# Patient Record
Sex: Male | Born: 1955 | Race: White | Hispanic: No | Marital: Married
Health system: Midwestern US, Academic
[De-identification: ages and names within clinical notes are randomized; demographics above are authoritative.]

## PROBLEM LIST (undated history)

## (undated) DIAGNOSIS — Z83719 Family history of colon polyps, unspecified: Secondary | ICD-10-CM

## (undated) DIAGNOSIS — Z86010 Personal history of colonic polyps: Secondary | ICD-10-CM

## (undated) DIAGNOSIS — K219 Gastro-esophageal reflux disease without esophagitis: Secondary | ICD-10-CM

## (undated) DIAGNOSIS — K589 Irritable bowel syndrome without diarrhea: Secondary | ICD-10-CM

## (undated) DIAGNOSIS — M1990 Unspecified osteoarthritis, unspecified site: Secondary | ICD-10-CM

## (undated) DIAGNOSIS — K449 Diaphragmatic hernia without obstruction or gangrene: Secondary | ICD-10-CM

## (undated) DIAGNOSIS — F419 Anxiety disorder, unspecified: Secondary | ICD-10-CM

## (undated) DIAGNOSIS — K602 Anal fissure, unspecified: Secondary | ICD-10-CM

## (undated) DIAGNOSIS — I4891 Unspecified atrial fibrillation: Secondary | ICD-10-CM

## (undated) DIAGNOSIS — I456 Pre-excitation syndrome: Secondary | ICD-10-CM

## (undated) DIAGNOSIS — Z8371 Family history of colonic polyps: Secondary | ICD-10-CM

## (undated) HISTORY — DX: Family history of colon polyps, unspecified: Z83.719

## (undated) HISTORY — DX: Unspecified osteoarthritis, unspecified site: M19.90

## (undated) HISTORY — PX: KNEE SURGERY: SHX244

## (undated) HISTORY — DX: Anxiety disorder, unspecified: F41.9

## (undated) HISTORY — DX: Gastro-esophageal reflux disease without esophagitis: K21.9

## (undated) HISTORY — DX: Family history of colonic polyps: Z83.71

## (undated) HISTORY — PX: POLYPECTOMY: SHX149

## (undated) HISTORY — PX: COLONOSCOPY: SHX174

## (undated) HISTORY — DX: Pre-excitation syndrome: I45.6

## (undated) HISTORY — PX: BACK SURGERY: SHX140

## (undated) HISTORY — DX: Personal history of colonic polyps: Z86.010

## (undated) HISTORY — PX: CARDIAC ELECTROPHYSIOLOGY STUDY AND ABLATION: SHX1294

## (undated) HISTORY — DX: Anal fissure, unspecified: K60.2

## (undated) HISTORY — DX: Irritable bowel syndrome, unspecified: K58.9

## (undated) HISTORY — DX: Diaphragmatic hernia without obstruction or gangrene: K44.9

---

## 1999-04-30 DIAGNOSIS — K602 Anal fissure, unspecified: Secondary | ICD-10-CM

## 1999-04-30 HISTORY — DX: Anal fissure, unspecified: K60.2

## 1999-05-15 ENCOUNTER — Other Ambulatory Visit: Admission: RE | Admit: 1999-05-15 | Discharge: 1999-05-15 | Payer: Self-pay | Admitting: Gastroenterology

## 1999-05-15 ENCOUNTER — Encounter (INDEPENDENT_AMBULATORY_CARE_PROVIDER_SITE_OTHER): Payer: Self-pay | Admitting: *Deleted

## 1999-05-15 DIAGNOSIS — K219 Gastro-esophageal reflux disease without esophagitis: Secondary | ICD-10-CM

## 1999-05-15 DIAGNOSIS — Z86010 Personal history of colonic polyps: Secondary | ICD-10-CM

## 1999-05-15 DIAGNOSIS — K449 Diaphragmatic hernia without obstruction or gangrene: Secondary | ICD-10-CM

## 1999-05-15 HISTORY — DX: Diaphragmatic hernia without obstruction or gangrene: K44.9

## 1999-05-15 HISTORY — DX: Gastro-esophageal reflux disease without esophagitis: K21.9

## 1999-05-15 HISTORY — DX: Personal history of colonic polyps: Z86.010

## 2001-04-12 ENCOUNTER — Other Ambulatory Visit: Admission: RE | Admit: 2001-04-12 | Discharge: 2001-04-12 | Payer: Self-pay | Admitting: Dermatology

## 2002-05-03 ENCOUNTER — Emergency Department (HOSPITAL_COMMUNITY): Admission: EM | Admit: 2002-05-03 | Discharge: 2002-05-03 | Payer: Self-pay | Admitting: Emergency Medicine

## 2004-08-12 ENCOUNTER — Other Ambulatory Visit: Admission: RE | Admit: 2004-08-12 | Discharge: 2004-08-12 | Payer: Self-pay | Admitting: Dermatology

## 2006-05-08 ENCOUNTER — Emergency Department (HOSPITAL_COMMUNITY): Admission: EM | Admit: 2006-05-08 | Discharge: 2006-05-09 | Payer: Self-pay | Admitting: Emergency Medicine

## 2007-04-11 ENCOUNTER — Emergency Department (HOSPITAL_COMMUNITY): Admission: EM | Admit: 2007-04-11 | Discharge: 2007-04-11 | Payer: Self-pay | Admitting: Emergency Medicine

## 2007-11-02 ENCOUNTER — Ambulatory Visit (HOSPITAL_COMMUNITY): Admission: RE | Admit: 2007-11-02 | Discharge: 2007-11-02 | Payer: Self-pay | Admitting: Orthopaedic Surgery

## 2010-10-25 ENCOUNTER — Other Ambulatory Visit: Payer: Self-pay | Admitting: Gastroenterology

## 2010-11-08 ENCOUNTER — Encounter: Payer: Self-pay | Admitting: Gastroenterology

## 2010-11-08 ENCOUNTER — Other Ambulatory Visit (INDEPENDENT_AMBULATORY_CARE_PROVIDER_SITE_OTHER): Payer: BC Managed Care – PPO

## 2010-11-08 ENCOUNTER — Ambulatory Visit (INDEPENDENT_AMBULATORY_CARE_PROVIDER_SITE_OTHER): Payer: BC Managed Care – PPO | Admitting: Gastroenterology

## 2010-11-08 DIAGNOSIS — Z86010 Personal history of colonic polyps: Secondary | ICD-10-CM

## 2010-11-08 DIAGNOSIS — K219 Gastro-esophageal reflux disease without esophagitis: Secondary | ICD-10-CM

## 2010-11-08 LAB — BASIC METABOLIC PANEL
BUN: 15 mg/dL (ref 6–23)
CO2: 30 mEq/L (ref 19–32)
Calcium: 9.9 mg/dL (ref 8.4–10.5)
Chloride: 105 mEq/L (ref 96–112)
Creatinine, Ser: 0.8 mg/dL (ref 0.4–1.5)
GFR: 100.19 mL/min (ref >60.00)
Glucose, Bld: 76 mg/dL (ref 70–99)
Potassium: 4.5 mEq/L (ref 3.5–5.1)
Sodium: 139 mEq/L (ref 135–145)

## 2010-11-08 LAB — CBC WITH DIFFERENTIAL/PLATELET
Basophils Absolute: 0 10*3/uL (ref 0.0–0.1)
Basophils Relative: 0.3 % (ref 0.0–3.0)
Eosinophils Absolute: 0.1 10*3/uL (ref 0.0–0.7)
Eosinophils Relative: 3.3 % (ref 0.0–5.0)
HCT: 40.1 % (ref 39.0–52.0)
Hemoglobin: 14 g/dL (ref 13.0–17.0)
Lymphocytes Relative: 28.3 % (ref 12.0–46.0)
Lymphs Abs: 1.2 10*3/uL (ref 0.7–4.0)
MCHC: 35 g/dL (ref 30.0–36.0)
MCV: 90.2 fl (ref 78.0–100.0)
Monocytes Absolute: 0.5 10*3/uL (ref 0.1–1.0)
Monocytes Relative: 12.1 % — ABNORMAL HIGH (ref 3.0–12.0)
Neutro Abs: 2.3 10*3/uL (ref 1.4–7.7)
Neutrophils Relative %: 56 % (ref 43.0–77.0)
Platelets: 191 10*3/uL (ref 150.0–400.0)
RBC: 4.44 Mil/uL (ref 4.22–5.81)
RDW: 12.9 % (ref 11.5–14.6)
WBC: 4.1 10*3/uL — ABNORMAL LOW (ref 4.5–10.5)

## 2010-11-08 LAB — TSH: TSH: 0.96 u[IU]/mL (ref 0.35–5.50)

## 2010-11-08 LAB — FERRITIN: Ferritin: 209.4 ng/mL (ref 22.0–322.0)

## 2010-11-08 LAB — HEPATIC FUNCTION PANEL
ALT: 24 U/L (ref 0–53)
AST: 23 U/L (ref 0–37)
Albumin: 4.6 g/dL (ref 3.5–5.2)
Alkaline Phosphatase: 67 U/L (ref 39–117)
Bilirubin, Direct: 0.1 mg/dL (ref 0.0–0.3)
Total Bilirubin: 0.8 mg/dL (ref 0.3–1.2)
Total Protein: 8.5 g/dL — ABNORMAL HIGH (ref 6.0–8.3)

## 2010-11-08 LAB — VITAMIN B12: Vitamin B-12: 303 pg/mL (ref 211–911)

## 2010-11-08 LAB — IBC PANEL: Iron: 78 ug/dL (ref 42–165)

## 2010-11-08 MED ORDER — PEG-KCL-NACL-NASULF-NA ASC-C 100 G PO SOLR: 1.0000 | Freq: Once | ORAL | Status: DC

## 2010-11-08 MED ORDER — DEXLANSOPRAZOLE 60 MG PO CPDR: 60.0000 mg | DELAYED_RELEASE_CAPSULE | Freq: Every day | ORAL | Status: AC

## 2010-11-08 NOTE — Patient Instructions (Signed)
Stop your Prilosec and start Dexilant once a day 30 min before breakfast, Given samples and rx sent to your pharmacy Your prescription(s) have been sent to you pharmacy.  Your procedure has been scheduled for 11/11/2010, please follow the seperate instructions.  Please go to the basement today for your labs.  Today you watched the gerd movie in the office.    Diet for GERD or PUD Nutrition therapy can help ease the discomfort of gastroesophageal reflux disease (GERD) and peptic ulcer disease (PUD).  HOME CARE INSTRUCTIONS  Eat your meals slowly, in a relaxed setting.   Eat 5 to 6 small meals per day.   If a food causes distress, stop eating it for a period of time.  FOODS TO AVOID:  Coffee, regular or decaffeinated.  Cola beverages, regular or low calorie.   Tea, regular or decaffeinated.   Pepper.   Cocoa.   High fat foods including meats.   Butter, margarine, hydrogenated oil (trans fats).  Peppermint or spearmint (if you have GERD).   Fruits and vegetables as tolerated.   Alcoholic beverages.   Nicotine (smoking or chewing). This is one of the most potent stimulants to acid production in the gastrointestinal tract.   Any food that seems to aggravate your condition.   If you have questions regarding your diet, call your caregiver's office or a registered dietitian. OTHER TIPS IF YOU HAVE GERD:  Lying flat may make symptoms worse. Keep the head of your bed raised 6 to 9 inches by using a foam wedge or blocks under the legs of the bed.   Do not lay down until 3 hours after eating a meal.   Daily physical activity may help reduce symptoms.  MAKE SURE YOU:   Understand these instructions.   Will watch your condition.   Will get help right away if you are not doing well or get worse.  Document Released: 05/05/2005 Document Re-Released: 09/21/2008 [facility] Patient Information 2011 [place], [place].

## 2010-11-08 NOTE — Progress Notes (Signed)
History of Present Illness:  This is a 55 year old Caucasian male with chronic acid reflux symptoms unresponsive to Prilosec 20 mg a day. Last endoscopy and colonoscopy were 12 years ago. He denies dysphagia, anorexia, weight loss, or any hepatobiliary complaints. His appetite is good and his weight is stable. And he denies any specific food intolerances. He did have a adenomatous polyp removed 12 years ago, and has a strong family history of colonic polyposis in his brothers. He has mild constipation with gas and bloating but denies melena or hematochezia. His acid reflux mostly during the day without dysphagia. He denies any hepatobiliary or systemic complaints.  I have reviewed this patient's present history, medical and surgical past history, allergies and medications.     ROS: The remainder of the 10 point ROS is negative... 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 He also has diffuse osteoarthritis and takes 3 Advil every morning. He does not smoke, use ethanol, or other NSAIDs. 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 He also has had skin cancers removed previously.   Past Medical History  Diagnosis Date  . Hiatal hernia   . Esophageal reflux   . Personal history of colonic polyps 2000    tubulovillous adenoma  . Wolff-Parkinson-White (WPW) syndrome   . Anxiety   . IBS (irritable bowel syndrome)   . Family hx colonic polyps   . Rectal fissure   . Arthritis   . Hemorrhoids    Past Surgical History  Procedure Date  . Knee surgery     Bilateral   . Back surgery     x 2     reports that he has quit smoking. He has never used smokeless tobacco. He reports that he does not drink alcohol or use illicit drugs. family history includes Colon polyps in his brother.  There is no history of Colon cancer. Allergies  Allergen Reactions  . Vicodin (Hydrocodone-Acetaminophen)          General well developed well nourished patient in no acute distress, appearing his stated age Eyes PERRLA, no icterus, fundoscopic exam per opthamologist Skin no lesions noted Neck supple, no adenopathy, no thyroid enlargement, no tenderness Chest clear to percussion and auscultation Heart no significant murmurs, gallops or rubs noted Abdomen no hepatosplenomegaly masses or tenderness, BS normal.  Extremities no acute joint lesions, edema, phlebitis or evidence of cellulitis. Neurologic patient oriented x 3, cranial nerves intact, no focal neurologic deficits noted. Psychological mental status normal and normal affect.  Assessment and plan: Refractory GERD with probable enlarging hiatal hernia. We need to exclude infection with H. pylori and NSAID  Damage to his upper intestinal tract. The symptoms do not seem consistent with any hepatobiliary dysfunction. I've changed him to Dexilant 60 mg every morning with standard antireflux maneuvers, and we'll proceed with diagnostic endoscopy. He also needs followup colonoscopy per his history of colon polyps. We will do his procedure with propofol sedation because of his history of cardiac arrhythmias. I also check screening laboratory parameters today including anemia profile.  No diagnosis found.

## 2010-11-11 ENCOUNTER — Encounter: Payer: Self-pay | Admitting: Gastroenterology

## 2010-11-11 ENCOUNTER — Telehealth: Payer: Self-pay | Admitting: *Deleted

## 2010-11-11 ENCOUNTER — Ambulatory Visit (AMBULATORY_SURGERY_CENTER): Payer: BC Managed Care – PPO | Admitting: Gastroenterology

## 2010-11-11 DIAGNOSIS — K209 Esophagitis, unspecified without bleeding: Secondary | ICD-10-CM

## 2010-11-11 DIAGNOSIS — K219 Gastro-esophageal reflux disease without esophagitis: Secondary | ICD-10-CM

## 2010-11-11 DIAGNOSIS — E8809 Other disorders of plasma-protein metabolism, not elsewhere classified: Secondary | ICD-10-CM

## 2010-11-11 DIAGNOSIS — K449 Diaphragmatic hernia without obstruction or gangrene: Secondary | ICD-10-CM

## 2010-11-11 DIAGNOSIS — Z86010 Personal history of colonic polyps: Secondary | ICD-10-CM

## 2010-11-11 DIAGNOSIS — R779 Abnormality of plasma protein, unspecified: Secondary | ICD-10-CM

## 2010-11-11 MED ORDER — SODIUM CHLORIDE 0.9 % IV SOLN: 500.0000 mL | INTRAVENOUS | Status: DC

## 2010-11-11 NOTE — Telephone Encounter (Signed)
Message copied by Florene Glen on Mon Nov 11, 2010  4:53 PM ------      Message from: Jarold Motto, DAVID R      Created: Mon Nov 11, 2010  1:59 PM       NEEDS SERUM PROTEIN ELECTROPHORESIS

## 2010-11-11 NOTE — Patient Instructions (Signed)
Discharge instructions given with verbal understanding. Handouts on hiatal hernia given. Resume previous medications.

## 2010-11-12 ENCOUNTER — Telehealth: Payer: Self-pay | Admitting: *Deleted

## 2010-11-12 ENCOUNTER — Other Ambulatory Visit: Payer: BC Managed Care – PPO

## 2010-11-12 DIAGNOSIS — E8809 Other disorders of plasma-protein metabolism, not elsewhere classified: Secondary | ICD-10-CM

## 2010-11-12 DIAGNOSIS — R779 Abnormality of plasma protein, unspecified: Secondary | ICD-10-CM

## 2010-11-12 LAB — CELIAC PANEL 10
Endomysial Screen: NEGATIVE
IgA: 360 mg/dL (ref 68–379)

## 2010-11-12 NOTE — Telephone Encounter (Signed)
lmom for pt to call back

## 2010-11-12 NOTE — Telephone Encounter (Signed)

## 2010-11-12 NOTE — Telephone Encounter (Signed)
Spoke with pt's wife and explained pt needs another lab drawn at his convenience; wife stated understanding.

## 2010-11-13 ENCOUNTER — Other Ambulatory Visit: Payer: Self-pay | Admitting: Gastroenterology

## 2010-11-13 DIAGNOSIS — K219 Gastro-esophageal reflux disease without esophagitis: Secondary | ICD-10-CM

## 2010-11-14 LAB — PROTEIN ELECTROPHORESIS, SERUM
Albumin ELP: 52 % — ABNORMAL LOW (ref 55.8–66.1)
Alpha-1-Globulin: 3 % (ref 2.9–4.9)
Beta 2: 5.4 % (ref 3.2–6.5)
Beta Globulin: 4.9 % (ref 4.7–7.2)
Gamma Globulin: 27.4 % — ABNORMAL HIGH (ref 11.1–18.8)

## 2010-11-15 ENCOUNTER — Encounter: Payer: Self-pay | Admitting: Gastroenterology

## 2011-01-21 MED ORDER — SODIUM CHLORIDE 0.45 % IV SOLN: Freq: Once | INTRAVENOUS | Status: DC

## 2011-01-22 ENCOUNTER — Ambulatory Visit (HOSPITAL_COMMUNITY)
Admission: RE | Admit: 2011-01-22 | Payer: BC Managed Care – PPO | Source: Ambulatory Visit | Admitting: Internal Medicine

## 2011-01-22 ENCOUNTER — Encounter (INDEPENDENT_AMBULATORY_CARE_PROVIDER_SITE_OTHER): Payer: Self-pay | Admitting: Internal Medicine

## 2011-01-22 SURGERY — COLONOSCOPY
Anesthesia: Moderate Sedation

## 2011-07-22 ENCOUNTER — Encounter (HOSPITAL_COMMUNITY): Payer: Self-pay | Admitting: Emergency Medicine

## 2011-07-22 ENCOUNTER — Emergency Department (HOSPITAL_COMMUNITY)
Admission: EM | Admit: 2011-07-22 | Discharge: 2011-07-23 | Disposition: A | Discharge status: Home / Self Care | Payer: BC Managed Care – PPO | Attending: Emergency Medicine | Admitting: Emergency Medicine

## 2011-07-22 ENCOUNTER — Emergency Department (HOSPITAL_COMMUNITY): Payer: BC Managed Care – PPO

## 2011-07-22 ENCOUNTER — Other Ambulatory Visit: Payer: Self-pay

## 2011-07-22 DIAGNOSIS — R0602 Shortness of breath: Secondary | ICD-10-CM | POA: Insufficient documentation

## 2011-07-22 DIAGNOSIS — K589 Irritable bowel syndrome without diarrhea: Secondary | ICD-10-CM | POA: Insufficient documentation

## 2011-07-22 DIAGNOSIS — I493 Ventricular premature depolarization: Secondary | ICD-10-CM

## 2011-07-22 DIAGNOSIS — I4949 Other premature depolarization: Secondary | ICD-10-CM | POA: Insufficient documentation

## 2011-07-22 DIAGNOSIS — K219 Gastro-esophageal reflux disease without esophagitis: Secondary | ICD-10-CM | POA: Insufficient documentation

## 2011-07-22 DIAGNOSIS — R002 Palpitations: Secondary | ICD-10-CM

## 2011-07-22 DIAGNOSIS — R079 Chest pain, unspecified: Secondary | ICD-10-CM | POA: Insufficient documentation

## 2011-07-22 LAB — CBC
HCT: 39 % (ref 39.0–52.0)
MCH: 30.8 pg (ref 26.0–34.0)
MCHC: 35.9 g/dL (ref 30.0–36.0)
MCV: 85.7 fL (ref 78.0–100.0)
Platelets: 201 10*3/uL (ref 150–400)
RDW: 12.7 % (ref 11.5–15.5)

## 2011-07-22 LAB — POCT I-STAT TROPONIN I: Troponin i, poc: 0 ng/mL (ref 0.00–0.08)

## 2011-07-22 LAB — BASIC METABOLIC PANEL
CO2: 27 mEq/L (ref 19–32)
Glucose, Bld: 170 mg/dL — ABNORMAL HIGH (ref 70–99)

## 2011-07-22 NOTE — ED Notes (Signed)
Patient transported to X-ray 

## 2011-07-22 NOTE — ED Notes (Signed)
Patient with no c/o chest pain at this time, patient does c/o slight pain in right lower rib pain.

## 2011-07-22 NOTE — ED Provider Notes (Signed)
History     CSN: [id number]  Arrival date & time 07/22/11  2035   First MD Initiated Contact with Patient 07/22/11 2131      Chief Complaint  Patient presents with  . Palpitations    (Consider location/radiation/quality/duration/timing/severity/associated sxs/prior treatment) HPI  History provided by the patient.  56 year old male with a history of Wolfe Parkinson White presenting with complaint of palpitations. Patient's symptoms began gradually about 4 days ago and have been intermittent and progressively, worsening and more frequent since yesterday.  Palpitations are described as skipping without racing sensation.  Patient reports improvement while supine and occasionally with activity. Patient reports associated shortness of breath only with palpitations as well as anxiety.  Pt denies sweats, nausea, or cough, and pt initially denies CP, but then later reports ~4 episodes of sharp, Rt lower lateral chest pain lasting seconds and currently CP-free.  Patient denies any new medications or increased caffeine use; patient states he is not drinking caffeine at baseline. Patient has taken Cialis about 4 days ago but states that he has taken this in the past without associated difficulty.    Symptoms are similar to prior palpitations, but patient states they generally do resolve within one to 2 days.  Patient has not been seen recently by another physician and has not seen his cardiologist Ulyess Mort) for an unknown amount of time.  Pt was on a medication for his WPW (?quinidex) but stopped taking this about 20 years ago.   Past Medical History  Diagnosis Date  . Hiatal hernia   . Esophageal reflux   . Personal history of colonic polyps 2000    tubulovillous adenoma  . Wolff-Parkinson-White (WPW) syndrome   . Anxiety   . IBS (irritable bowel syndrome)   . Family hx colonic polyps   . Rectal fissure   . Arthritis   . Hemorrhoids     Past Surgical History  Procedure Date  . Knee  surgery     Bilateral   . Back surgery     x 2   . Colonoscopy   . Polypectomy     Family History  Problem Relation Age of Onset  . Colon polyps Brother   . Colon cancer Neg Hx     History  Substance Use Topics  . Smoking status: Former Games developer  . Smokeless tobacco: Never Used  . Alcohol Use: No      Review of Systems  Constitutional: Negative for fever, chills and diaphoresis.  HENT: Negative for congestion, sore throat and rhinorrhea.   Eyes: Negative for pain and visual disturbance.  Respiratory: Positive for shortness of breath. Negative for cough.   Cardiovascular: Positive for chest pain (as noted above) and palpitations. Negative for leg swelling.  Gastrointestinal: Negative for nausea, vomiting, abdominal pain and diarrhea.  Genitourinary: Negative for dysuria and hematuria.  Musculoskeletal: Negative for back pain and gait problem.  Skin: Negative for rash and wound.  Neurological: Negative for dizziness and headaches.  Psychiatric/Behavioral: Negative for confusion and agitation.  All other systems reviewed and are negative.    Allergies  Vicodin  Home Medications   Current Outpatient Rx  Name Route Sig Dispense Refill  . ALPRAZOLAM 1 MG PO TABS Oral Take 0.125 mg by mouth daily.     Marland Kitchen VITAMIN D PO Oral Take 1 tablet by mouth daily.    . IBUPROFEN 200 MG PO TABS Oral Take 600 mg by mouth every morning.    Marland Kitchen PRILOSEC PO Oral Take 1 tablet by  mouth daily.    Marland Kitchen TADALAFIL 10 MG PO TABS Oral Take 10 mg by mouth once a week. As needed for erectile dysfunction      BP 147/100  Pulse 67  Temp(Src) 98.1 F (36.7 C) (Oral)  Resp 16  SpO2 99%  Physical Exam  Nursing note and vitals reviewed. Constitutional: He is oriented to person, place, and time. He appears well-developed and well-nourished. No distress.  HENT:  Head: Normocephalic and atraumatic.  Right Ear: External ear normal.  Left Ear: External ear normal.  Nose: Nose normal.  Mouth/Throat:  Oropharynx is clear and moist.  Eyes: Conjunctivae and EOM are normal. Pupils are equal, round, and reactive to light.  Neck: Normal range of motion. Neck supple.  Cardiovascular: Normal rate and intact distal pulses.   No murmur heard.      Mildly irregular rhythm with frequent PVCs noted on the monitor.  Pulmonary/Chest: Effort normal and breath sounds normal. No respiratory distress.  Abdominal: Soft. Bowel sounds are normal. He exhibits no distension. There is no tenderness.  Musculoskeletal: Normal range of motion. He exhibits no edema.  Neurological: He is alert and oriented to person, place, and time.  Skin: Skin is dry. No rash noted. He is not diaphoretic.  Psychiatric: He has a normal mood and affect. Judgment normal.    ED Course  Procedures (including critical care time)   Date: 07/22/2011  Rate: 73  Rhythm: normal sinus rhythm with frequent PVCs  QRS Axis: normal  Intervals: normal  ST/T Wave abnormalities: normal No delta waves or PR depression.  Old EKG Reviewed:  No prior for comparison    Labs Reviewed  BASIC METABOLIC PANEL - Abnormal; Notable for the following:    Sodium 134 (*)    Glucose, Bld 170 (*)    All other components within normal limits  PRO B NATRIURETIC PEPTIDE - Abnormal; Notable for the following:    Pro B Natriuretic peptide (BNP) 165.4 (*)    All other components within normal limits  CBC  POCT I-STAT TROPONIN I  TSH   Dg Chest 2 View  07/22/2011  *RADIOLOGY REPORT*  Clinical Data: Chest pain and shortness of breath.  CHEST - 2 VIEW  Comparison: 05/08/2006  Findings: Heart size and vascularity are normal and the lungs are clear.  No osseous abnormality.  IMPRESSION: Normal chest.  Original Report Authenticated By: Gwynn Burly, M.D.     1. Palpitations   2. Frequent PVCs       MDM  56 year old male with a history of WPW presenting with complaint of 4 days of palpitations. Patient with 4 episodes of seconds-long atypical right  lateral chest pain not consistent with ACS and resolved at the time of ED arrival.  Exam unremarkable.  Initial EKG was sinus rhythm, frequent PVCs, and without signs of WPW.  Chest x-ray without acute pathology and the labs with normal troponin, lytes, and Hgb.  Pt reports mildly improved frequency, possibly 2/2 position.  Will rec increased fluid intake, avoiding caffeine/meds that may exacerbate his Sx's, and cards f/u for further mgmt and possible outpt cardiac monitoring.        Particia Lather, MD 07/22/11 [phone]

## 2011-07-22 NOTE — ED Notes (Signed)
Pt c/o mid chest tightness, onset earlier today.  St's heart has been skipping for approx 4 days

## 2011-07-22 NOTE — Discharge Instructions (Signed)
See your cardiologist for further management.  Return immediately for worsening symptoms, persistent palpitations, chest pain, or other concerns.    Premature Ventricular Contraction Premature ventricular contraction (PVC) is an irregularity of the heart rhythm involving extra or skipped heartbeats. In some cases, they may occur without obvious cause or heart disease. Other times, they can be caused by an electrolyte change in the blood. These need to be corrected. They can also be seen when there is not enough oxygen going to the heart. A common cause of this is plaque or cholesterol buildup. This buildup decreases the blood supply to the heart. In addition, extra beats may be caused or aggravated by:  Excessive smoking.   Alcohol consumption.   Caffeine.   Certain medications   Some street drugs.  SYMPTOMS   The sensation of feeling your heart skipping a beat (palpitations).   In many cases, the person may have no symptoms.  SIGNS AND TESTS   A physical examination may show an occasional irregularity, but if the PVC beats do not happen often, they may not be found on physical exam.   Blood pressure is usually normal.   Other tests that may find extra beats of the heart are:   An EKG (electrocardiogram)   A Holter monitor which can monitor your heart over longer periods of time   An Angiogram (study of the heart arteries).  TREATMENT  Usually extra heartbeats do not need treatment. The condition is treated only if symptoms are severe or if extra beats are very frequent or are causing problems. An underlying cause, if discovered, may also require treatment.  Treatment may also be needed if there may be a risk for other more serious cardiac arrhythmias.  PREVENTION   Moderation in caffeine, alcohol, and tobacco use may reduce the risk of ectopic heartbeats in some people.   Exercise often helps people who lead a sedentary (inactive) lifestyle.  PROGNOSIS  PVC heartbeats are  generally harmless and do not need treatment.  RISKS AND COMPLICATIONS   Ventricular tachycardia (occasionally).   There usually are no complications.   Other arrhythmias (occasionally).  SEEK IMMEDIATE MEDICAL CARE IF:   You feel palpitations that are frequent or continual.   You develop chest pain or other problems such as shortness of breath, sweating, or nausea and vomiting.   You become light-headed or faint (pass out).   You get worse or do not improve with treatment.  Document Released: 12/21/2003 Document Revised: 04/24/2011 Document Reviewed: 07/02/2007 [facility] Patient Information 2012 [place], [place].

## 2011-07-22 NOTE — ED Notes (Signed)
Pt returned from radiology. Denies any complaints at this time. Pt in bigeminy at this time. Resp are unlabored. Skin is warm and dry. VSS. Will continue to monitor.

## 2011-07-22 NOTE — ED Provider Notes (Signed)
I saw and evaluated the patient, reviewed the resident's note and I agree with the findings and plan. I personally evaluated the ECG and agree with the interpretation of the resident  Palpitations have decreased while in the ER.  His frequency PVCs is also significantly decreased.  Outpatient cardiology followup  Lyanne Co, MD 07/22/11 [phone]

## 2011-07-22 NOTE — ED Notes (Signed)
MD at bedside. 

## 2011-08-06 ENCOUNTER — Ambulatory Visit (INDEPENDENT_AMBULATORY_CARE_PROVIDER_SITE_OTHER): Payer: BC Managed Care – PPO | Admitting: Cardiology

## 2011-08-06 ENCOUNTER — Encounter: Payer: Self-pay | Admitting: Cardiology

## 2011-08-06 VITALS — BP 160/90 | HR 70 | Ht 73.0 in | Wt 196.0 lb

## 2011-08-06 DIAGNOSIS — I10 Essential (primary) hypertension: Secondary | ICD-10-CM | POA: Insufficient documentation

## 2011-08-06 DIAGNOSIS — R0600 Dyspnea, unspecified: Secondary | ICD-10-CM | POA: Insufficient documentation

## 2011-08-06 DIAGNOSIS — R002 Palpitations: Secondary | ICD-10-CM

## 2011-08-06 DIAGNOSIS — I456 Pre-excitation syndrome: Secondary | ICD-10-CM

## 2011-08-06 DIAGNOSIS — R0609 Other forms of dyspnea: Secondary | ICD-10-CM

## 2011-08-06 NOTE — Assessment & Plan Note (Signed)
I will bring the patient back for a POET (Plain Old Exercise Test). This will allow me to screen for obstructive coronary disease, risk stratify and very importantly provide a prescription for exercise.   

## 2011-08-06 NOTE — Assessment & Plan Note (Signed)
I suspect his blood pressure is higher than he notes. He does check it at home and says it's in the 130 to 145 range systolic. This should be evaluated at the time of his treadmill.

## 2011-08-06 NOTE — Progress Notes (Signed)
HPI The patient presents for evaluation of palpitations. He had a history of WPW years ago and was on Quinidex for years. However, he has been off of this for about 10 years and he's had no sustained tachypalpitations for all this time. He does well with occasional palpitations that are isolated entity not as significant problems. However, several days ago he had increasing palpitations that persisted and got worse over about 2 days. He did not have any syncope or presyncope but would feel slightly lightheaded with this. He didn't describe any chest discomfort, neck or arm discomfort. He was not having any new breathlessness though he might get slightly short of breath with some activities. He presented to the emergency room where he was noted to have premature ectopic complexes. I have reviewed his EKG and those notes.  His electrolytes and TSH were normal though his pro BNP was very slightly elevated. He was not admitted to the hospital and his medications were not changed. He said he has since had near resolution of the symptoms. He is otherwise able to be quite vigorously active without bringing on any symptoms.   Allergies  Allergen Reactions  . Vicodin (Hydrocodone-Acetaminophen) Other (See Comments)    Unknown.    Current Outpatient Prescriptions  Medication Sig Dispense Refill  . ALPRAZolam (XANAX) 1 MG tablet Take 0.125 mg by mouth daily.       . Cholecalciferol (VITAMIN D PO) Take 1 tablet by mouth daily.      Marland Kitchen ibuprofen (ADVIL,MOTRIN) 200 MG tablet Take 600 mg by mouth every morning.      . Omeprazole (PRILOSEC PO) Take 1 tablet by mouth daily.      . tadalafil (CIALIS) 10 MG tablet Take 10 mg by mouth once a week. As needed for erectile dysfunction        Facility-Administered Medications Ordered in Other Visits  Medication Dose Route Frequency Provider Last Rate Last Dose  . 0.45 % sodium chloride infusion   Intravenous Once Malissa Hippo, MD        Past Medical History    Diagnosis Date  . Hiatal hernia   . Esophageal reflux   . Personal history of colonic polyps 2000    tubulovillous adenoma  . Wolff-Parkinson-White (WPW) syndrome   . Anxiety   . IBS (irritable bowel syndrome)   . Family hx colonic polyps   . Rectal fissure   . Arthritis   . Hemorrhoids     Past Surgical History  Procedure Date  . Knee surgery     Bilateral   . Back surgery     x 2   . Colonoscopy   . Polypectomy     Family History  Problem Relation Age of Onset  . Colon polyps Brother   . Colon cancer Neg Hx     History   Social History  . Marital Status: Married    Spouse Name: N/A    Number of Children: 1  . Years of Education: N/A   Occupational History  . Electrician     Social History Main Topics  . Smoking status: Former Smoker    Quit date: 08/05/1981  . Smokeless tobacco: Never Used  . Alcohol Use: No  . Drug Use: No  . Sexually Active: Not on file   Other Topics Concern  . Not on file   Social History Narrative   2 caffeine drinks daily     ROS:  Positive for constipation and reflux, arthritis, seasonal  allergies.Otherwise as stated in the HPI and negative for all other systems.   PHYSICAL EXAM BP 160/90  Pulse 70  Ht 6\' 1"  (1.854 m)  Wt 196 lb (88.905 kg)  BMI 25.86 kg/m2 GENERAL:  Well appearing HEENT:  Pupils equal round and reactive, fundi not visualized, oral mucosa unremarkable NECK:  No jugular venous distention, waveform within normal limits, carotid upstroke brisk and symmetric, no bruits, no thyromegaly LYMPHATICS:  No cervical, inguinal adenopathy LUNGS:  Clear to auscultation bilaterally BACK:  No CVA tenderness CHEST:  Unremarkable HEART:  PMI not displaced or sustained,S1 and S2 within normal limits, no S3, no S4, no clicks, no rubs, no murmurs ABD:  Flat, positive bowel sounds normal in frequency in pitch, no bruits, no rebound, no guarding, no midline pulsatile mass, no hepatomegaly, no splenomegaly EXT:  2 plus  pulses throughout, no edema, no cyanosis no clubbing SKIN:  No rashes no nodules NEURO:  Cranial nerves II through XII grossly intact, motor grossly intact throughout PSYCH:  Cognitively intact, oriented to person place and time  EKG:  Sinus rhythm with premature ectopic complexes, right bundle branch block, rate 73, axis within normal limits, intervals within normal limits, no acute ST-T wave changes  07/22/11  ASSESSMENT AND PLAN

## 2011-08-06 NOTE — Patient Instructions (Signed)
The current medical regimen is effective;  continue present plan and medications.  Your physician has requested that you have an exercise tolerance test. For further information please visit [email or web address]. Please also follow instruction sheet, as given.   

## 2011-08-06 NOTE — Assessment & Plan Note (Signed)
At this point we discussed possible symptomatic treatment with beta blockers but he doesn't want to do this. Since I have settled down no change in therapy is indicated. I do not suspect any sustained dysrhythmias related to his previous accessory pathway.

## 2011-08-20 ENCOUNTER — Encounter: Payer: Self-pay | Admitting: Physician Assistant

## 2011-08-20 ENCOUNTER — Ambulatory Visit (INDEPENDENT_AMBULATORY_CARE_PROVIDER_SITE_OTHER): Payer: BC Managed Care – PPO | Admitting: Physician Assistant

## 2011-08-20 DIAGNOSIS — R002 Palpitations: Secondary | ICD-10-CM

## 2011-08-20 MED ORDER — METOPROLOL SUCCINATE ER 25 MG PO TB24: 25.0000 mg | ORAL_TABLET | Freq: Every day | ORAL | Status: DC

## 2011-08-20 NOTE — Procedures (Signed)
Exercise Treadmill Test  Pre-Exercise Testing Evaluation Rhythm: normal sinus  Rate: 68   PR:  .15 QRS:  .10  QT:  .39 QTc: .41     Test  Exercise Tolerance Test Ordering MD: Angelina Sheriff, MD  Interpreting MD:  Tereso Newcomer PA-C  Unique Test No: 1  Treadmill:  1  Indication for ETT: Palpitations  Contraindication to ETT: No   Stress Modality: exercise - treadmill  Cardiac Imaging Performed: non   Protocol: standard Bruce - maximal  Max BP:  218/90  Max MPHR (bpm):  163 85% MPR (bpm):  139  MPHR obtained (bpm):  151 % MPHR obtained:  92%  Reached 85% MPHR (min:sec):  9:06 Total Exercise Time (min-sec):  10:51  Workload in METS:  13.1 Borg Scale: 15  Reason ETT Terminated:  exaggerated hypertensive response    ST Segment Analysis At Rest: normal ST segments - no evidence of significant ST depression With Exercise: no evidence of significant ST depression  Other Information Arrhythmia:  PVCs noted in recovery; Bigeminal pattern at times; patient symptomatic Angina during ETT:  absent (0) Quality of ETT:  diagnostic  ETT Interpretation:  normal - no evidence of ischemia by ST analysis  Comments: Good exercise tolerance. No chest pain. Hypertensive BP response to exercise with elevated baseline BP. No ST-T changes to suggest ischemia.  PVCs noted in recovery; frequent with bigeminal pattern at times; patient symptomatic  Recommendations: BP too high. Long d/w patient regarding tx. He was symptomatic with PVCs. Will start Toprol XL 25 mg QD. Follow up with Dr. Rollene Rotunda in 1 month. Tereso Newcomer, PA-C  12:49 PM 08/20/2011

## 2011-08-20 NOTE — Patient Instructions (Addendum)
Check BP 3 times a week and call us with BP readings in 2 weeks. Schedule follow up with Dr. Rollene Rotunda in 1 month.

## 2011-09-19 ENCOUNTER — Ambulatory Visit (INDEPENDENT_AMBULATORY_CARE_PROVIDER_SITE_OTHER): Payer: BC Managed Care – PPO | Admitting: Cardiology

## 2011-09-19 ENCOUNTER — Encounter: Payer: Self-pay | Admitting: Cardiology

## 2011-09-19 VITALS — BP 140/95 | HR 60 | Ht 74.0 in | Wt 196.4 lb

## 2011-09-19 DIAGNOSIS — R0609 Other forms of dyspnea: Secondary | ICD-10-CM

## 2011-09-19 DIAGNOSIS — I4949 Other premature depolarization: Secondary | ICD-10-CM

## 2011-09-19 DIAGNOSIS — R002 Palpitations: Secondary | ICD-10-CM

## 2011-09-19 DIAGNOSIS — I10 Essential (primary) hypertension: Secondary | ICD-10-CM

## 2011-09-19 DIAGNOSIS — R0600 Dyspnea, unspecified: Secondary | ICD-10-CM

## 2011-09-19 DIAGNOSIS — I493 Ventricular premature depolarization: Secondary | ICD-10-CM

## 2011-09-19 DIAGNOSIS — I456 Pre-excitation syndrome: Secondary | ICD-10-CM

## 2011-09-19 NOTE — Assessment & Plan Note (Signed)
I will place a Holter monitor to further qualify this. We discussed other therapies at this point he would like to be conservative as these are not bothering him overtly. He will remain on the meds as listed.

## 2011-09-19 NOTE — Assessment & Plan Note (Signed)
There is no evidence of sustained dysrhythmia associated with WPW. No change in therapy is indicated.

## 2011-09-19 NOTE — Progress Notes (Signed)
   HPI The patient presents for evaluation of palpitations. He had a history of WPW years ago.  He was complaining of limitations when I saw him previously. I sent him for an exercise treadmill test. There was no evidence of ischemia. However, he had PVCs in recovery. He was symptomatic with these. He has since been treated with low dose of beta blocker. He denies any ongoing symptoms such as chest pressure, neck or arm discomfort. He can work very hard without bringing on any of the symptoms. He does notice the palpitations at times very fatigued or after he's worked hard. He thinks are less bothersome with the beta blocker though this has lowered his heart rate some. He's not describing presyncope or syncope however.   Allergies  Allergen Reactions  . Vicodin (Hydrocodone-Acetaminophen) Other (See Comments)    Unknown.   Current Outpatient Prescriptions on File Prior to Visit  Medication Sig Dispense Refill  . ALPRAZolam (XANAX) 1 MG tablet Take 0.125 mg by mouth daily.       . Cholecalciferol (VITAMIN D PO) Take 1 tablet by mouth daily.      Marland Kitchen ibuprofen (ADVIL,MOTRIN) 200 MG tablet Take 600 mg by mouth every morning.      . metoprolol succinate (TOPROL XL) 25 MG 24 hr tablet Take 1 tablet (25 mg total) by mouth daily.  30 tablet  5  . Omeprazole (PRILOSEC PO) Take 1 tablet by mouth daily.      . tadalafil (CIALIS) 10 MG tablet Take 10 mg by mouth once a week. As needed for erectile dysfunction        Past Medical History  Diagnosis Date  . Hiatal hernia   . Esophageal reflux   . Personal history of colonic polyps 2000    tubulovillous adenoma  . Wolff-Parkinson-White (WPW) syndrome   . Anxiety   . IBS (irritable bowel syndrome)   . Family hx colonic polyps   . Rectal fissure   . Arthritis   . Hemorrhoids     Past Surgical History  Procedure Date  . Knee surgery     Bilateral   . Back surgery     x 2   . Colonoscopy   . Polypectomy     ROS:  Positive for constipation and  reflux, arthritis, seasonal allergies.Otherwise as stated in the HPI and negative for all other systems.   PHYSICAL EXAM BP 140/95  Pulse 60  Ht 6\' 2"  (1.88 m)  Wt 196 lb 6.4 oz (89.086 kg)  BMI 25.22 kg/m2 GENERAL:  Well appearing HEENT:  Pupils equal round and reactive, fundi not visualized, oral mucosa unremarkable NECK:  No jugular venous distention, waveform within normal limits, carotid upstroke brisk and symmetric, no bruits, no thyromegaly LYMPHATICS:  No cervical, inguinal adenopathy LUNGS:  Clear to auscultation bilaterally BACK:  No CVA tenderness CHEST:  Unremarkable HEART:  PMI not displaced or sustained,S1 and S2 within normal limits, no S3, no S4, no clicks, no rubs, no murmurs ABD:  Flat, positive bowel sounds normal in frequency in pitch, no bruits, no rebound, no guarding, no midline pulsatile mass, no hepatomegaly, no splenomegaly EXT:  2 plus pulses throughout, no edema, no cyanosis no clubbing SKIN:  No rashes no nodules NEURO:  Cranial nerves II through XII grossly intact, motor grossly intact throughout PSYCH:  Cognitively intact, oriented to person place and time   ASSESSMENT AND PLAN

## 2011-09-19 NOTE — Patient Instructions (Signed)
The current medical regimen is effective;  continue present plan and medications.  Your physician has recommended that you wear a holter monitor 48 hours. Holter monitors are medical devices that record the heart's electrical activity. Doctors most often use these monitors to diagnose arrhythmias. Arrhythmias are problems with the speed or rhythm of the heartbeat. The monitor is a small, portable device. You can wear one while you do your normal daily activities. This is usually used to diagnose what is causing palpitations/syncope (passing out).  Follow up with Dr Antoine Poche after wearing your monitor

## 2011-09-19 NOTE — Assessment & Plan Note (Signed)
He denies any current SOB.

## 2011-09-19 NOTE — Assessment & Plan Note (Signed)
The blood pressure is at target. No change in medications is indicated. We will continue with therapeutic lifestyle changes (TLC).  

## 2011-09-30 ENCOUNTER — Encounter (INDEPENDENT_AMBULATORY_CARE_PROVIDER_SITE_OTHER): Payer: BC Managed Care – PPO

## 2011-09-30 DIAGNOSIS — R002 Palpitations: Secondary | ICD-10-CM

## 2011-09-30 DIAGNOSIS — I493 Ventricular premature depolarization: Secondary | ICD-10-CM

## 2011-10-06 ENCOUNTER — Ambulatory Visit (INDEPENDENT_AMBULATORY_CARE_PROVIDER_SITE_OTHER): Payer: BC Managed Care – PPO | Admitting: Cardiology

## 2011-10-06 ENCOUNTER — Encounter: Payer: Self-pay | Admitting: Cardiology

## 2011-10-06 VITALS — BP 136/84 | HR 58 | Ht 74.0 in | Wt 199.1 lb

## 2011-10-06 DIAGNOSIS — R002 Palpitations: Secondary | ICD-10-CM

## 2011-10-06 DIAGNOSIS — R0609 Other forms of dyspnea: Secondary | ICD-10-CM

## 2011-10-06 DIAGNOSIS — R0989 Other specified symptoms and signs involving the circulatory and respiratory systems: Secondary | ICD-10-CM

## 2011-10-06 DIAGNOSIS — R0600 Dyspnea, unspecified: Secondary | ICD-10-CM

## 2011-10-06 DIAGNOSIS — I10 Essential (primary) hypertension: Secondary | ICD-10-CM

## 2011-10-06 DIAGNOSIS — I456 Pre-excitation syndrome: Secondary | ICD-10-CM

## 2011-10-06 NOTE — Assessment & Plan Note (Signed)
I will check an echocardiogram. TSH was normal. The results of the monitor are listed above. If he starts to have increasing palpitations he and I will need to discuss antiarrhythmic therapy. At that point I would need to make sure his heart is normal. He had no ischemia on stress test and will have the echo. We have discussed the risks benefits of antiarrhythmic therapy if it is needed.

## 2011-10-06 NOTE — Patient Instructions (Signed)
The current medical regimen is effective;  continue present plan and medications.  Your physician has requested that you have an echocardiogram. Echocardiography is a painless test that uses sound waves to create images of your heart. It provides your doctor with information about the size and shape of your heart and how well your heart's chambers and valves are working. This procedure takes approximately one hour. There are no restrictions for this procedure.  Follow up in 6 months with Dr Hochrein.  You will receive a letter in the mail 2 months before you are due.  Please call us when you receive this letter to schedule your follow up appointment.  

## 2011-10-06 NOTE — Progress Notes (Signed)
   HPI The patient presents for evaluation of palpitations. He had a history of WPW years ago.  At the last visit he was having some increased palpitations. I ordered a Holter monitor. He said that he had increased palpitations prior to putting on the monitor but didn't feel the very much when he wore the monitor. I reviewed this with him today and he had PVCs less than 0.1% and PACs less than 0.1%. He had no sustained tachycardia palpitations. He had no presyncope or syncope. He denies any chest pressure, neck or arm discomfort. He's had no new shortness of breath, PND or orthopnea. He thinks the lack of symptoms is related to the fact that he's not been as active as his current job is winding down.  He thinks that his symptoms will worsen soon because he is about to get busier.   Allergies  Allergen Reactions  . Vicodin (Hydrocodone-Acetaminophen) Other (See Comments)    Unknown.   Current Outpatient Prescriptions on File Prior to Visit  Medication Sig Dispense Refill  . ALPRAZolam (XANAX) 1 MG tablet Take 0.125 mg by mouth daily.       . Cholecalciferol (VITAMIN D PO) Take 1 tablet by mouth daily.      Marland Kitchen ibuprofen (ADVIL,MOTRIN) 200 MG tablet Take 600 mg by mouth every morning.      . metoprolol succinate (TOPROL XL) 25 MG 24 hr tablet Take 1 tablet (25 mg total) by mouth daily.  30 tablet  5  . Omeprazole (PRILOSEC PO) Take 1 tablet by mouth daily.      . tadalafil (CIALIS) 10 MG tablet Take 10 mg by mouth once a week. As needed for erectile dysfunction        Past Medical History  Diagnosis Date  . Hiatal hernia   . Esophageal reflux   . Personal history of colonic polyps 2000    tubulovillous adenoma  . Wolff-Parkinson-White (WPW) syndrome   . Anxiety   . IBS (irritable bowel syndrome)   . Family hx colonic polyps   . Rectal fissure   . Arthritis   . Hemorrhoids     Past Surgical History  Procedure Date  . Knee surgery     Bilateral   . Back surgery     x 2   .  Colonoscopy   . Polypectomy     ROS:  Positive for constipation and reflux, arthritis, seasonal allergies.Otherwise as stated in the HPI and negative for all other systems.   PHYSICAL EXAM BP 136/84  Pulse 58  Ht 6\' 2"  (1.88 m)  Wt 199 lb 1.9 oz (90.32 kg)  BMI 25.57 kg/m2 GENERAL:  Well appearing HEENT:  Pupils equal round and reactive, fundi not visualized, oral mucosa unremarkable NECK:  No jugular venous distention, waveform within normal limits, carotid upstroke brisk and symmetric, no bruits, no thyromegaly LYMPHATICS:  No cervical, inguinal adenopathy LUNGS:  Clear to auscultation bilaterally BACK:  No CVA tenderness CHEST:  Unremarkable HEART:  PMI not displaced or sustained,S1 and S2 within normal limits, no S3, no S4, no clicks, no rubs, no murmurs ABD:  Flat, positive bowel sounds normal in frequency in pitch, no bruits, no rebound, no guarding, no midline pulsatile mass, no hepatomegaly, no splenomegaly EXT:  2 plus pulses throughout, no edema, no cyanosis no clubbing   ASSESSMENT AND PLAN

## 2011-10-06 NOTE — Assessment & Plan Note (Signed)
There is no evidence of recurrent dysrhythmias related to this.  The plan is as above.

## 2011-10-06 NOTE — Assessment & Plan Note (Signed)
He has no new shortness of breath. He has continued mild dyspnea as described previously. I will check the echocardiogram.

## 2011-10-06 NOTE — Assessment & Plan Note (Signed)
The blood pressure is at target. No change in medications is indicated. We will continue with therapeutic lifestyle changes (TLC).  

## 2011-10-09 ENCOUNTER — Other Ambulatory Visit: Payer: Self-pay

## 2011-10-09 ENCOUNTER — Ambulatory Visit (HOSPITAL_COMMUNITY): Payer: BC Managed Care – PPO | Attending: Cardiology

## 2011-10-09 DIAGNOSIS — R002 Palpitations: Secondary | ICD-10-CM | POA: Insufficient documentation

## 2011-10-09 DIAGNOSIS — I079 Rheumatic tricuspid valve disease, unspecified: Secondary | ICD-10-CM | POA: Insufficient documentation

## 2011-10-09 DIAGNOSIS — R0609 Other forms of dyspnea: Secondary | ICD-10-CM | POA: Insufficient documentation

## 2011-10-09 DIAGNOSIS — I10 Essential (primary) hypertension: Secondary | ICD-10-CM | POA: Insufficient documentation

## 2011-10-09 DIAGNOSIS — I059 Rheumatic mitral valve disease, unspecified: Secondary | ICD-10-CM | POA: Insufficient documentation

## 2011-10-09 DIAGNOSIS — R0989 Other specified symptoms and signs involving the circulatory and respiratory systems: Secondary | ICD-10-CM | POA: Insufficient documentation

## 2011-10-17 ENCOUNTER — Telehealth: Payer: Self-pay | Admitting: Cardiology

## 2011-10-17 NOTE — Telephone Encounter (Signed)
Pt aware of results he is continuing to have palpitations and has been scheduled for follow up 6/28.  He will call back if they worsen.

## 2011-10-17 NOTE — Telephone Encounter (Signed)
New Problem:    Patient's wife is returning your call about her husbands ECHO results.  Please call back.

## 2011-11-14 ENCOUNTER — Ambulatory Visit: Payer: BC Managed Care – PPO | Admitting: Cardiology

## 2012-02-11 ENCOUNTER — Other Ambulatory Visit: Payer: Self-pay | Admitting: Physician Assistant

## 2012-03-02 ENCOUNTER — Other Ambulatory Visit: Payer: BC Managed Care – PPO

## 2012-03-02 ENCOUNTER — Other Ambulatory Visit (INDEPENDENT_AMBULATORY_CARE_PROVIDER_SITE_OTHER): Payer: BC Managed Care – PPO

## 2012-03-02 ENCOUNTER — Encounter: Payer: Self-pay | Admitting: Gastroenterology

## 2012-03-02 ENCOUNTER — Ambulatory Visit (INDEPENDENT_AMBULATORY_CARE_PROVIDER_SITE_OTHER): Payer: BC Managed Care – PPO | Admitting: Gastroenterology

## 2012-03-02 VITALS — BP 124/74 | HR 56 | Ht 73.5 in | Wt 189.0 lb

## 2012-03-02 DIAGNOSIS — R109 Unspecified abdominal pain: Secondary | ICD-10-CM

## 2012-03-02 DIAGNOSIS — K219 Gastro-esophageal reflux disease without esophagitis: Secondary | ICD-10-CM

## 2012-03-02 LAB — BASIC METABOLIC PANEL
BUN: 14 mg/dL (ref 6–23)
CO2: 29 mEq/L (ref 19–32)
Chloride: 104 mEq/L (ref 96–112)
Creatinine, Ser: 0.9 mg/dL (ref 0.4–1.5)
Glucose, Bld: 109 mg/dL — ABNORMAL HIGH (ref 70–99)
Potassium: 5.1 mEq/L (ref 3.5–5.1)

## 2012-03-02 LAB — LIPASE: Lipase: 32 U/L (ref 11.0–59.0)

## 2012-03-02 LAB — CBC WITH DIFFERENTIAL/PLATELET
Eosinophils Absolute: 0.1 10*3/uL (ref 0.0–0.7)
Eosinophils Relative: 2.2 % (ref 0.0–5.0)
HCT: 40.8 % (ref 39.0–52.0)
Lymphs Abs: 1 10*3/uL (ref 0.7–4.0)
MCHC: 33.9 g/dL (ref 30.0–36.0)
MCV: 90.9 fl (ref 78.0–100.0)
Monocytes Absolute: 0.4 10*3/uL (ref 0.1–1.0)
Neutrophils Relative %: 62.1 % (ref 43.0–77.0)
Platelets: 179 10*3/uL (ref 150.0–400.0)
WBC: 4.2 10*3/uL — ABNORMAL LOW (ref 4.5–10.5)

## 2012-03-02 LAB — HEPATIC FUNCTION PANEL
ALT: 23 U/L (ref 0–53)
Total Bilirubin: 0.5 mg/dL (ref 0.3–1.2)
Total Protein: 8.5 g/dL — ABNORMAL HIGH (ref 6.0–8.3)

## 2012-03-02 LAB — SEDIMENTATION RATE: Sed Rate: 31 mm/hr — ABNORMAL HIGH (ref 0–22)

## 2012-03-02 LAB — AMYLASE: Amylase: 50 U/L (ref 27–131)

## 2012-03-02 NOTE — Patient Instructions (Addendum)
You have been scheduled for an abdominal ultrasound at [facility] (1st floor of hospital) on 03/05/12 at 830 am. Please arrive 15 minutes prior to your appointment for registration. Make certain not to have anything to eat or drink 6 hours prior to your appointment. Should you need to reschedule your appointment, please contact radiology at [phone]. Your physician has requested that you go to the basement for  lab work before leaving today. CC:  Kari Baars MD

## 2012-03-02 NOTE — Progress Notes (Signed)
This is a 56 year old Caucasian male with chronic acid reflux doing well on daily PPI therapy. 3 weeks ago he developed mid abdominal pain with nausea but no emesis. He was using NSAIDs at that time, and has subsequently stopped these meds, and is on currently Prilosec 20 mg a day. He did have some associated diarrhea related to frequent and acid use. He denies any specific hepatobiliary complaints, melena, hematochezia, fever chills. He currently is fairly asymptomatic. Review of his record shows no previous ultrasound or CT scans of his abdomen. He is up-to-date on his endoscopy and colonoscopy. Patient denies abuse of alcohol or any history of previous hepatitis or pancreatitis.  Current Medications, Allergies, Past Medical History, Past Surgical History, Family History and Social History were reviewed in [employer] record.  Pertinent Review of Systems Negative  Allergies  Allergen Reactions  . Vicodin (Hydrocodone-Acetaminophen) Other (See Comments)    Unknown.   Outpatient Prescriptions Prior to Visit  Medication Sig Dispense Refill  . ALPRAZolam (XANAX) 1 MG tablet Take 0.125 mg by mouth daily.       . Cholecalciferol (VITAMIN D PO) Take 1 tablet by mouth daily.      . metoprolol succinate (TOPROL-XL) 25 MG 24 hr tablet TAKE ONE TABLET BY MOUTH EVERY DAY  30 tablet  9  . Omeprazole (PRILOSEC PO) Take 20 mg by mouth daily.       . tadalafil (CIALIS) 10 MG tablet Take 10 mg by mouth once a week. As needed for erectile dysfunction      . ibuprofen (ADVIL,MOTRIN) 200 MG tablet Take 600 mg by mouth every morning.       Facility-Administered Medications Prior to Visit  Medication Dose Route Frequency Provider Last Rate Last Dose  . 0.45 % sodium chloride infusion   Intravenous Once Malissa Hippo, MD       Past Medical History  Diagnosis Date  . Hiatal hernia 05/15/1999  . Esophageal reflux 05/15/1999  . 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 Wolff-Parkinson-White (WPW) syndrome   . Anxiety   . IBS (irritable bowel syndrome)   . Family hx colonic polyps   . Rectal fissure 04/30/1999  . Arthritis   . Hemorrhoids    Past Surgical History  Procedure Date  . Knee surgery     Bilateral   . Back surgery     x 2   . Colonoscopy   . Polypectomy    History   Social History  . Marital Status: Married    Spouse Name: N/A    Number of Children: 1  . Years of Education: N/A   Occupational History  . Electrician     Social History Main Topics  . Smoking status: Former Smoker    Quit date: 08/05/1981  . Smokeless tobacco: Never Used  . Alcohol Use: No  . Drug Use: No  . Sexually Active: None   Other Topics Concern  . None   Social History Narrative   2 caffeine drinks daily. (Currently off.)   Family History  Problem Relation Age of Onset  . Colon polyps Brother   . Colon cancer Neg Hx   . Coronary artery disease Brother 82    Stent        Physical Exam: Healthy-appearing patient in no acute distress. I cannot appreciate stigmata of chronic liver disease. His abdomen shows no organomegaly, masses, tenderness, or distention. Bowel sounds are normal. Mental status is normal. Chest is  clear and he appears to be in a regular rhythm without murmurs gallops or rubs. Blood pressure 124/74, pulse 56 and regular, and weight 189 pounds with BMI of 24.60.    Assessment and Plan: Sudden severe upper nominal pain several weeks ago which has currently abated. Considerations would include cholelithiasis with associated pancreatitis, NSAID gastritis, or viral gastroenteritis. I have ordered labs for review and also will obtain upper GI ultrasound exam. He is to continue other medications as per primary care as listed and reviewed. Encounter Diagnosis  Name Primary?  . Abdominal pain Yes

## 2012-03-05 ENCOUNTER — Ambulatory Visit (HOSPITAL_COMMUNITY)
Admission: RE | Admit: 2012-03-05 | Discharge: 2012-03-05 | Disposition: A | Discharge status: Home / Self Care | Payer: BC Managed Care – PPO | Source: Ambulatory Visit | Attending: Gastroenterology | Admitting: Gastroenterology

## 2012-03-05 DIAGNOSIS — R109 Unspecified abdominal pain: Secondary | ICD-10-CM | POA: Insufficient documentation

## 2012-03-05 DIAGNOSIS — N200 Calculus of kidney: Secondary | ICD-10-CM | POA: Insufficient documentation

## 2012-04-14 ENCOUNTER — Ambulatory Visit: Payer: BC Managed Care – PPO | Admitting: Cardiology

## 2012-04-23 ENCOUNTER — Encounter: Payer: Self-pay | Admitting: Cardiology

## 2012-04-23 ENCOUNTER — Ambulatory Visit (INDEPENDENT_AMBULATORY_CARE_PROVIDER_SITE_OTHER): Payer: BC Managed Care – PPO | Admitting: Cardiology

## 2012-04-23 VITALS — BP 130/84 | HR 44 | Ht 73.2 in | Wt 188.0 lb

## 2012-04-23 DIAGNOSIS — R0600 Dyspnea, unspecified: Secondary | ICD-10-CM

## 2012-04-23 DIAGNOSIS — R0609 Other forms of dyspnea: Secondary | ICD-10-CM

## 2012-04-23 DIAGNOSIS — R002 Palpitations: Secondary | ICD-10-CM

## 2012-04-23 DIAGNOSIS — I10 Essential (primary) hypertension: Secondary | ICD-10-CM

## 2012-04-23 DIAGNOSIS — I456 Pre-excitation syndrome: Secondary | ICD-10-CM

## 2012-04-23 NOTE — Patient Instructions (Addendum)
The current medical regimen is effective;  continue present plan and medications.  Follow up in 1 year with Dr Hochrein.  You will receive a letter in the mail 2 months before you are due.  Please call us when you receive this letter to schedule your follow up appointment.  

## 2012-04-23 NOTE — Progress Notes (Signed)
HPI The patient presents for evaluation of palpitations. He had a history of WPW years ago.  He was having increased palpitations earlier this year. Stress test was unremarkable. He had PVCs on the monitor. Echo was unremarkable. We decided to manage this conservatively.  Since that time he has done well. He denies any sustained episodes though he does occasionally get palpitations if he is fatigued. He certainly notices him with caffeine which he avoids. He has had no presyncope or syncope. He denies any chest pressure, neck or arm discomfort. He denies any shortness of breath, PND or orthopnea.   Allergies  Allergen Reactions  . Vicodin (Hydrocodone-Acetaminophen) Other (See Comments)    Unknown.   Current Outpatient Prescriptions on File Prior to Visit  Medication Sig Dispense Refill  . ALPRAZolam (XANAX) 1 MG tablet Take 0.125 mg by mouth daily.       . Cholecalciferol (VITAMIN D PO) Take 1 tablet by mouth daily.      Marland Kitchen ibuprofen (ADVIL,MOTRIN) 200 MG tablet Take 600 mg by mouth every morning.      . metoprolol succinate (TOPROL XL) 25 MG 24 hr tablet Take 1 tablet (25 mg total) by mouth daily.  30 tablet  5  . Omeprazole (PRILOSEC PO) Take 1 tablet by mouth daily.      . tadalafil (CIALIS) 10 MG tablet Take 10 mg by mouth once a week. As needed for erectile dysfunction        Past Medical History  Diagnosis Date  . Hiatal hernia 05/15/1999  . Esophageal reflux 05/15/1999  . Personal history of colonic polyps 05/15/1999    tubulovillous adenoma  . Wolff-Parkinson-White (WPW) syndrome   . Anxiety   . IBS (irritable bowel syndrome)   . Family hx colonic polyps   . Rectal fissure 04/30/1999  . Arthritis   . Hemorrhoids     Past Surgical History  Procedure Date  . Knee surgery     Bilateral   . Back surgery     x 2   . Colonoscopy   . Polypectomy     ROS:  Positive for constipation and reflux, arthritis, seasonal allergies.Otherwise as stated in the HPI and negative  for all other systems.   PHYSICAL EXAM BP 130/84  Pulse 44  Ht 6' 1.2" (1.859 m)  Wt 188 lb (85.276 kg)  BMI 24.67 kg/m2 GENERAL:  Well appearing NECK:  No jugular venous distention, waveform within normal limits, carotid upstroke brisk and symmetric, no bruits, no thyromegaly LUNGS:  Clear to auscultation bilaterally BACK:  No CVA tenderness CHEST:  Unremarkable HEART:  PMI not displaced or sustained,S1 and S2 within normal limits, no S3, no S4, no clicks, no rubs, no murmurs ABD:  Flat, positive bowel sounds normal in frequency in pitch, no bruits, no rebound, no guarding, no midline pulsatile mass, no hepatomegaly, no splenomegaly EXT:  2 plus pulses throughout, no edema, no cyanosis no clubbing  EKG:  Sinus bradycardia, rate 44, axis within normal limits, intervals within normal limits, no acute ST-T wave changes.  ASSESSMENT AND PLAN  Palpitation -  At this point he does not want any change in therapy is such as antiarrhythmics but will contact us if he has increasing symptoms.  WPW (Wolff-Parkinson-White syndrome) -  There is no evidence of recurrent dysrhythmias related to this. The plan is as above.  Dyspnea -  His breathing is improved. No further workup is suggested.  HTN (hypertension) - The blood pressure is at target. No  change in medications is indicated. We will continue with therapeutic lifestyle changes (TLC).

## 2012-07-03 ENCOUNTER — Other Ambulatory Visit: Payer: Self-pay

## 2012-12-09 ENCOUNTER — Other Ambulatory Visit: Payer: Self-pay | Admitting: Cardiology

## 2012-12-15 ENCOUNTER — Emergency Department: Payer: Self-pay | Admitting: Emergency Medicine

## 2012-12-15 LAB — BASIC METABOLIC PANEL
BUN: 19 mg/dL — ABNORMAL HIGH (ref 7–18)
Co2: 29 mmol/L (ref 21–32)
Creatinine: 1.5 mg/dL — ABNORMAL HIGH (ref 0.60–1.30)
Glucose: 123 mg/dL — ABNORMAL HIGH (ref 65–99)
Osmolality: 279 (ref 275–301)

## 2012-12-15 LAB — URINALYSIS, COMPLETE
Glucose,UR: NEGATIVE mg/dL (ref 0–75)
Ketone: NEGATIVE
Leukocyte Esterase: NEGATIVE
Protein: 100
RBC,UR: 1764 /HPF (ref 0–5)
Squamous Epithelial: NONE SEEN

## 2012-12-15 LAB — CBC
HCT: 36.6 % — ABNORMAL LOW (ref 40.0–52.0)
HGB: 13 g/dL (ref 13.0–18.0)
MCHC: 35.4 g/dL (ref 32.0–36.0)
MCV: 89 fL (ref 80–100)
RBC: 4.13 10*6/uL — ABNORMAL LOW (ref 4.40–5.90)
RDW: 12.7 % (ref 11.5–14.5)
WBC: 6.7 10*3/uL (ref 3.8–10.6)

## 2013-01-12 ENCOUNTER — Other Ambulatory Visit (HOSPITAL_COMMUNITY): Payer: Self-pay | Admitting: Pulmonary Disease

## 2013-01-12 DIAGNOSIS — R911 Solitary pulmonary nodule: Secondary | ICD-10-CM

## 2013-01-12 DIAGNOSIS — N200 Calculus of kidney: Secondary | ICD-10-CM

## 2013-01-18 ENCOUNTER — Ambulatory Visit (HOSPITAL_COMMUNITY)
Admission: RE | Admit: 2013-01-18 | Discharge: 2013-01-18 | Disposition: A | Discharge status: Home / Self Care | Payer: BC Managed Care – PPO | Source: Ambulatory Visit | Attending: Pulmonary Disease | Admitting: Pulmonary Disease

## 2013-01-18 DIAGNOSIS — Z85820 Personal history of malignant melanoma of skin: Secondary | ICD-10-CM | POA: Insufficient documentation

## 2013-01-18 DIAGNOSIS — R911 Solitary pulmonary nodule: Secondary | ICD-10-CM

## 2013-01-18 DIAGNOSIS — N200 Calculus of kidney: Secondary | ICD-10-CM | POA: Insufficient documentation

## 2013-01-18 DIAGNOSIS — N210 Calculus in bladder: Secondary | ICD-10-CM | POA: Insufficient documentation

## 2013-01-18 MED ORDER — IOHEXOL 300 MG/ML  SOLN
80.0000 mL | Freq: Once | INTRAMUSCULAR | Status: AC | PRN
  Administered 2013-01-18: 80 mL via INTRAVENOUS

## 2013-03-24 ENCOUNTER — Other Ambulatory Visit: Payer: Self-pay

## 2013-05-27 ENCOUNTER — Encounter: Payer: Self-pay | Admitting: Cardiology

## 2013-05-27 ENCOUNTER — Ambulatory Visit (INDEPENDENT_AMBULATORY_CARE_PROVIDER_SITE_OTHER): Payer: BC Managed Care – PPO | Admitting: Cardiology

## 2013-05-27 VITALS — BP 173/89 | HR 53 | Ht 73.0 in | Wt 195.2 lb

## 2013-05-27 DIAGNOSIS — I456 Pre-excitation syndrome: Secondary | ICD-10-CM

## 2013-05-27 DIAGNOSIS — I10 Essential (primary) hypertension: Secondary | ICD-10-CM

## 2013-05-27 MED ORDER — TADALAFIL 10 MG PO TABS: 10.0000 mg | ORAL_TABLET | ORAL | Status: DC

## 2013-05-27 MED ORDER — METOPROLOL SUCCINATE ER 25 MG PO TB24: ORAL_TABLET | ORAL | Status: DC

## 2013-05-27 NOTE — Patient Instructions (Signed)
The current medical regimen is effective;  continue present plan and medications.  Follow up in 18 months with Dr Percival Spanish.  You will receive a letter in the mail 2 months before you are due.  Please call us when you receive this letter to schedule your follow up appointment.

## 2013-05-27 NOTE — Progress Notes (Signed)
HPI The patient presents for evaluation of palpitations. He had a history of WPW years ago.  He was having increased palpitations earlier this year. Stress test was unremarkable. He had PVCs on the monitor. Echo was unremarkable. We decided to manage this conservatively.  Since that time he has done well. He denies any sustained episodes though he does occasionally get palpitations if he is working very hard..  He has had no presyncope or syncope. He denies any chest pressure, neck or arm discomfort. He denies any shortness of breath, PND or orthopnea.   Allergies  Allergen Reactions  . Vicodin [Hydrocodone-Acetaminophen] Other (See Comments)    Unknown.   Current Outpatient Prescriptions on File Prior to Visit  Medication Sig Dispense Refill  . ALPRAZolam (XANAX) 1 MG tablet Take 0.125 mg by mouth daily.       . Cholecalciferol (VITAMIN D PO) Take 1 tablet by mouth daily.      Marland Kitchen ibuprofen (ADVIL,MOTRIN) 200 MG tablet Take 600 mg by mouth every morning.      . metoprolol succinate (TOPROL XL) 25 MG 24 hr tablet Take 1 tablet (25 mg total) by mouth daily.  30 tablet  5  . Omeprazole (PRILOSEC PO) Take 1 tablet by mouth daily.      . tadalafil (CIALIS) 10 MG tablet Take 10 mg by mouth once a week. As needed for erectile dysfunction        Past Medical History  Diagnosis Date  . Hiatal hernia 05/15/1999  . Esophageal reflux 05/15/1999  . Personal history of colonic polyps 05/15/1999    tubulovillous adenoma  . Wolff-Parkinson-White (WPW) syndrome   . Anxiety   . IBS (irritable bowel syndrome)   . Family hx colonic polyps   . Rectal fissure 04/30/1999  . Arthritis   . Hemorrhoids     Past Surgical History  Procedure Laterality Date  . Knee surgery      Bilateral   . Back surgery      x 2   . Colonoscopy    . Polypectomy      ROS:  Positive for constipation and reflux, arthritis, seasonal allergies.Otherwise as stated in the HPI and negative for all other systems.    PHYSICAL EXAM BP 173/89  Pulse 53  Ht 6\' 1"  (1.854 m)  Wt 195 lb 3.2 oz (88.542 kg)  BMI 25.76 kg/m2 GENERAL:  Well appearing NECK:  No jugular venous distention, waveform within normal limits, carotid upstroke brisk and symmetric, no bruits, no thyromegaly LUNGS:  Clear to auscultation bilaterally CHEST:  Unremarkable HEART:  PMI not displaced or sustained,S1 and S2 within normal limits, no S3, no S4, no clicks, no rubs, no murmurs ABD:  Flat, positive bowel sounds normal in frequency in pitch, no bruits, no rebound, no guarding, no midline pulsatile mass, no hepatomegaly, no splenomegaly EXT:  2 plus pulses throughout, no edema, no cyanosis no clubbing  EKG:  Sinus bradycardia, rate 53, axis within normal limits, intervals within normal limits, no acute ST-T wave changes.  05/27/2013   ASSESSMENT AND PLAN  Palpitation -  He is not having significant palpitations and no change in therapy or further studies are indicated.  WPW (Wolff-Parkinson-White syndrome) -  There is no evidence of recurrent dysrhythmias related to this. The plan is as above.  HTN (hypertension) - His BP is slightly elevated.  He will keep a BP diary and let me know if it is above target.  It has been OK on recent readings  here.

## 2013-06-13 ENCOUNTER — Other Ambulatory Visit (HOSPITAL_COMMUNITY): Payer: Self-pay | Admitting: Pulmonary Disease

## 2013-06-13 DIAGNOSIS — R911 Solitary pulmonary nodule: Secondary | ICD-10-CM

## 2013-06-15 ENCOUNTER — Telehealth: Payer: Self-pay | Admitting: Cardiology

## 2013-06-15 ENCOUNTER — Ambulatory Visit (HOSPITAL_COMMUNITY)
Admission: RE | Admit: 2013-06-15 | Discharge: 2013-06-15 | Disposition: A | Discharge status: Home / Self Care | Payer: BC Managed Care – PPO | Source: Ambulatory Visit | Attending: Pulmonary Disease | Admitting: Pulmonary Disease

## 2013-06-15 DIAGNOSIS — R918 Other nonspecific abnormal finding of lung field: Secondary | ICD-10-CM | POA: Insufficient documentation

## 2013-06-15 DIAGNOSIS — R911 Solitary pulmonary nodule: Secondary | ICD-10-CM | POA: Insufficient documentation

## 2013-06-15 MED ORDER — AMLODIPINE BESYLATE 5 MG PO TABS: 5.0000 mg | ORAL_TABLET | Freq: Every day | ORAL | Status: DC

## 2013-06-15 NOTE — Telephone Encounter (Signed)
Spoke with pt's wife regarding pt's BP. Wife states that pt was seen by Dr. Percival Spanish on 05/27/13 his BP  was slightly elevates 173/89, MD recommended for pt to take his BP every day, and to call if above target. According to wife for that last 2 weeks pt's BP has been elevated and has a headache.  Yesterday and today Pt's  BP has been 190/100. Pt takes Metoprolol 25 mg once a day.

## 2013-06-15 NOTE — Telephone Encounter (Signed)
New message   C/O blood pressure today 190/100. Left arm. Same reading on yesterday.  No chest pain .

## 2013-06-15 NOTE — Telephone Encounter (Signed)
Patients wife is calling back again. Please call back.

## 2013-06-15 NOTE — Telephone Encounter (Signed)
LMTCB 8:28am/PE

## 2013-06-15 NOTE — Telephone Encounter (Signed)
Dr. Radford Pax DOD aware of pt's high PB and recommends for pt to start Amlodipine 5 mg once a day, prescription sent to [facility].  Pt is to check his BP every day and keep a record of BP reading. Pt is to come for nurse visit for BP check in a week. Pt has an appointment  on 06/23/13 pt's wife aware.

## 2013-06-23 ENCOUNTER — Telehealth: Payer: Self-pay | Admitting: *Deleted

## 2013-06-23 ENCOUNTER — Ambulatory Visit (INDEPENDENT_AMBULATORY_CARE_PROVIDER_SITE_OTHER): Payer: BC Managed Care – PPO | Admitting: *Deleted

## 2013-06-23 VITALS — BP 134/82 | HR 60 | Wt 196.0 lb

## 2013-06-23 DIAGNOSIS — I10 Essential (primary) hypertension: Secondary | ICD-10-CM

## 2013-06-23 MED ORDER — AMLODIPINE BESYLATE 5 MG PO TABS: 5.0000 mg | ORAL_TABLET | Freq: Every day | ORAL | Status: DC

## 2013-06-23 NOTE — Telephone Encounter (Signed)
Patient came in for BP check 134/82, HR 60. Weight 196#. Dr. Percival Spanish noted BP and stated for patient to continue on Amlodipine 5 mg by mouth daily. Rx refill placed to patient pharmacy. Patient verbalized understanding and agreement. Discussed exercise and importance of medication compliance. Patient brought home BP monitor to double check. It was not correctly measuring BP. Patient stated he will be purchasing a new home BP monitor so that he can take his BP daily.

## 2013-06-23 NOTE — Progress Notes (Signed)
Patient recently started Amlodipine. BP today 134/82, HR 60. Patient states he feels well and has no further headache symptoms since starting the medication. He brought in his home BP monitor which did not read accurately. He states he will buy a new one so that he can continue to monitor his BP at home. Routed to Dr. Percival Spanish for further instructions regarding Amlodipine and BP.

## 2013-12-02 ENCOUNTER — Ambulatory Visit (INDEPENDENT_AMBULATORY_CARE_PROVIDER_SITE_OTHER): Payer: BC Managed Care – PPO | Admitting: Cardiology

## 2013-12-02 ENCOUNTER — Encounter: Payer: Self-pay | Admitting: Cardiology

## 2013-12-02 VITALS — BP 144/80 | HR 50 | Ht 73.5 in | Wt 194.8 lb

## 2013-12-02 DIAGNOSIS — Z79899 Other long term (current) drug therapy: Secondary | ICD-10-CM

## 2013-12-02 DIAGNOSIS — R5381 Other malaise: Secondary | ICD-10-CM

## 2013-12-02 DIAGNOSIS — R5383 Other fatigue: Secondary | ICD-10-CM

## 2013-12-02 DIAGNOSIS — I456 Pre-excitation syndrome: Secondary | ICD-10-CM

## 2013-12-02 DIAGNOSIS — T672XXA Heat cramp, initial encounter: Secondary | ICD-10-CM

## 2013-12-02 DIAGNOSIS — T672XXD Heat cramp, subsequent encounter: Secondary | ICD-10-CM

## 2013-12-02 DIAGNOSIS — I10 Essential (primary) hypertension: Secondary | ICD-10-CM

## 2013-12-02 DIAGNOSIS — Z5189 Encounter for other specified aftercare: Secondary | ICD-10-CM

## 2013-12-02 LAB — BASIC METABOLIC PANEL
BUN: 11 mg/dL (ref 6–23)
CHLORIDE: 102 meq/L (ref 96–112)
CO2: 29 meq/L (ref 19–32)
Calcium: 9.5 mg/dL (ref 8.4–10.5)
Creat: 1.04 mg/dL (ref 0.50–1.35)
Glucose, Bld: 90 mg/dL (ref 70–99)
Potassium: 4.4 mEq/L (ref 3.5–5.3)
Sodium: 138 mEq/L (ref 135–145)

## 2013-12-02 LAB — CBC
HCT: 39.2 % (ref 39.0–52.0)
HEMOGLOBIN: 13.7 g/dL (ref 13.0–17.0)
MCH: 30 pg (ref 26.0–34.0)
MCHC: 34.9 g/dL (ref 30.0–36.0)
MCV: 85.8 fL (ref 78.0–100.0)
Platelets: 253 10*3/uL (ref 150–400)
RBC: 4.57 MIL/uL (ref 4.22–5.81)
RDW: 13.5 % (ref 11.5–15.5)
WBC: 7.2 10*3/uL (ref 4.0–10.5)

## 2013-12-02 LAB — TSH: TSH: 0.553 u[IU]/mL (ref 0.350–4.500)

## 2013-12-02 NOTE — Assessment & Plan Note (Signed)
Pt seen by urgent care for night sweats, and severe cramping could not move extremities. He had been working outside in the heat that day.  They thought he had been too exposed to the heat.  No chest pain. No SOB.

## 2013-12-02 NOTE — Progress Notes (Signed)
12/02/2013   PCP: Alonza Bogus, MD   Chief Complaint  Patient presents with  . Acute Visit    2 weeks ago with nausea, sweats after working outside all day, seen at urgent care.    Primary Cardiologist: Dr. Vita Barley   HPI:  58 year old male here today after an episode of not feeling well after working outside.  When he arrived home he had nausea and then he awakened from sleep with being hot and nauseated, feeling stiff hard to move arms and legs.  He was diaphoretic and needed to change his night clothes. No chest pain and no SOB.  He was seen in urgent care in [place].  They thought he had been too hot - but he has felt well since.    He is here today to make sure he is ok.  He had a history of WPW years ago. He was having increased palpitations earlier this year. Stress test was unremarkable. He had PVCs on the monitor. Echo was unremarkable. We decided to manage this conservatively.    Allergies  Allergen Reactions  . Vicodin [Hydrocodone-Acetaminophen] Other (See Comments)    Unknown.    Current Outpatient Prescriptions  Medication Sig Dispense Refill  . ALPRAZolam (XANAX) 1 MG tablet Take 0.125 mg by mouth daily.       Marland Kitchen amLODipine (NORVASC) 5 MG tablet Take 1 tablet (5 mg total) by mouth daily.  30 tablet  5  . Cholecalciferol (VITAMIN D PO) Take 1 tablet by mouth daily.      . metoprolol succinate (TOPROL-XL) 25 MG 24 hr tablet TAKE ONE TABLET BY MOUTH EVERY DAY  30 tablet  11  . Omeprazole (PRILOSEC PO) Take 20 mg by mouth daily.       . tadalafil (CIALIS) 10 MG tablet Take 1 tablet (10 mg total) by mouth once a week. As needed for erectile dysfunction  10 tablet  3   No current facility-administered medications for this visit.   Facility-Administered Medications Ordered in Other Visits  Medication Dose Route Frequency Provider Last Rate Last Dose  . 0.45 % sodium chloride infusion   Intravenous Once Rogene Houston, MD        Past Medical  History  Diagnosis Date  . Hiatal hernia 05/15/1999  . Esophageal reflux 05/15/1999  . Personal history of colonic polyps 05/15/1999    tubulovillous adenoma  . Wolff-Parkinson-White (WPW) syndrome   . Anxiety   . IBS (irritable bowel syndrome)   . Family hx colonic polyps   . Rectal fissure 04/30/1999  . Arthritis   . Hemorrhoids     Past Surgical History  Procedure Laterality Date  . Knee surgery      Bilateral   . Back surgery      x 2   . Colonoscopy    . Polypectomy      PVV:ZSMOLMB:EM colds or fevers, no weight changes Skin:no rashes or ulcers HEENT:no blurred vision, no congestion CV:see HPI PUL:see HPI GI:no diarrhea constipation or melena, no indigestion GU:no hematuria, no dysuria MS:no joint pain, no claudication Neuro:no syncope, no lightheadedness Endo:no diabetes, no thyroid disease  Wt Readings from Last 3 Encounters:  12/02/13 194 lb 12.8 oz (88.361 kg)  06/23/13 196 lb (88.905 kg)  05/27/13 195 lb 3.2 oz (88.542 kg)    PHYSICAL EXAM BP 144/80  Pulse 50  Ht 6' 1.5" (1.867 m)  Wt 194 lb 12.8 oz (88.361 kg)  BMI 25.35 kg/m2 General:Pleasant affect, NAD Skin:Warm and dry, brisk capillary refill HEENT:normocephalic, sclera clear, mucus membranes moist Neck:supple, no JVD, no bruits  Heart:S1S2 RRR without murmur, gallup, rub or click Lungs:clear without rales, rhonchi, or wheezes WYO:VZCH, non tender, + BS, do not palpate liver spleen or masses Ext:no lower ext edema, 2+ pedal pulses, 2+ radial pulses Neuro:alert and oriented, MAE, follows commands, + facial symmetry  EKG:S Loletha Grayer but no acute changes from previous tracing  ASSESSMENT AND PLAN Heat cramps Pt seen by urgent care for night sweats, and severe cramping could not move extremities. He had been working outside in the heat that day.  They thought he had been too exposed to the heat.  No chest pain. No SOB.  WPW (Wolff-Parkinson-White syndrome) No awareness of racing HR.  EKG stable,  though HR 50.  Usual EKGs with HR of 53.  Pt will follow up with Dr. Vita Barley in several months unless problem re-occurs.  I will check K+ and kidneys.  Also TSH  HTN (hypertension) Stable

## 2013-12-02 NOTE — Assessment & Plan Note (Signed)
Stable

## 2013-12-02 NOTE — Assessment & Plan Note (Signed)
No awareness of racing HR.  EKG stable, though HR 50.  Usual EKGs with HR of 53.  Pt will follow up with Dr. Vita Barley in several months unless problem re-occurs.  I will check K+ and kidneys.  Also TSH

## 2013-12-02 NOTE — Patient Instructions (Signed)
Cecilie Kicks, NP has recommended you have blood work done. (BMP, TSH, CBC)  Your physician recommends that you schedule a follow-up appointment in 6 months with Dr Percival Spanish.

## 2014-06-05 ENCOUNTER — Other Ambulatory Visit: Payer: Self-pay

## 2014-06-05 ENCOUNTER — Other Ambulatory Visit: Payer: Self-pay | Admitting: *Deleted

## 2014-06-05 MED ORDER — AMLODIPINE BESYLATE 5 MG PO TABS: 5.0000 mg | ORAL_TABLET | Freq: Every day | ORAL | Status: DC

## 2014-06-08 ENCOUNTER — Other Ambulatory Visit: Payer: Self-pay

## 2014-06-08 MED ORDER — METOPROLOL SUCCINATE ER 25 MG PO TB24: ORAL_TABLET | ORAL | Status: DC

## 2014-06-23 ENCOUNTER — Encounter: Payer: Self-pay | Admitting: Cardiology

## 2014-06-23 ENCOUNTER — Ambulatory Visit (INDEPENDENT_AMBULATORY_CARE_PROVIDER_SITE_OTHER): Payer: BLUE CROSS/BLUE SHIELD | Admitting: Cardiology

## 2014-06-23 VITALS — BP 130/90 | HR 58 | Ht 73.0 in | Wt 196.8 lb

## 2014-06-23 DIAGNOSIS — R002 Palpitations: Secondary | ICD-10-CM

## 2014-06-23 NOTE — Patient Instructions (Signed)
Your physician recommends that you schedule a follow-up appointment in: as needed with Dr. Percival Spanish  We are ordering a monitor for you to wear for 24 hrs  We are ordering labs for you to do today

## 2014-06-23 NOTE — Progress Notes (Signed)
HPI The patient presents for evaluation of palpitations. He had a history of WPW years ago.  He was having increased palpitations earlier this year. Stress test was unremarkable. He had PVCs on the monitor. Echo was unremarkable. We decided to manage this conservatively.  He presents today for evaluation of some palpitations. He has a couple of symptoms. One is that he might feel his heart racing or pounding area and he might feel a little breathless. This doesn't happen when he is physically active but happens after he has stopped what he is doing. For instance he had a nightly had to dig a grave for a family pet.  Later that evening he felt his heart beating hard. He also at times has a "vibration" in his chest. He feels like it might even be in the muscle. It might happen with his heart rate skipping or independently. He's had to get up out of bed for this. He might get anxious with this. He's not having any chest pressure, neck or arm discomfort. He's not having PND or orthopnea. He can do vigorous work.   Allergies  Allergen Reactions  . Vicodin [Hydrocodone-Acetaminophen] Other (See Comments)    Unknown.   Current Outpatient Prescriptions on File Prior to Visit  Medication Sig Dispense Refill  . ALPRAZolam (XANAX) 1 MG tablet Take 0.125 mg by mouth daily.       . Cholecalciferol (VITAMIN D PO) Take 1 tablet by mouth daily.      Marland Kitchen ibuprofen (ADVIL,MOTRIN) 200 MG tablet Take 600 mg by mouth every morning.      . metoprolol succinate (TOPROL XL) 25 MG 24 hr tablet Take 1 tablet (25 mg total) by mouth daily.  30 tablet  5  . Omeprazole (PRILOSEC PO) Take 1 tablet by mouth daily.      . tadalafil (CIALIS) 10 MG tablet Take 10 mg by mouth once a week. As needed for erectile dysfunction        Past Medical History  Diagnosis Date  . Hiatal hernia 05/15/1999  . Esophageal reflux 05/15/1999  . Personal history of colonic polyps 05/15/1999    tubulovillous adenoma  . Wolff-Parkinson-White  (WPW) syndrome   . Anxiety   . IBS (irritable bowel syndrome)   . Family hx colonic polyps   . Rectal fissure 04/30/1999  . Arthritis   . Hemorrhoids     Past Surgical History  Procedure Laterality Date  . Knee surgery      Bilateral   . Back surgery      x 2   . Colonoscopy    . Polypectomy      ROS:  Positive for constipation and reflux, arthritis, seasonal allergies.Otherwise as stated in the HPI and negative for all other systems.   PHYSICAL EXAM BP 130/90 mmHg  Pulse 58  Ht 6\' 1"  (1.854 m)  Wt 196 lb 12.8 oz (89.268 kg)  BMI 25.97 kg/m2 GENERAL:  Well appearing NECK:  No jugular venous distention, waveform within normal limits, carotid upstroke brisk and symmetric, no bruits, no thyromegaly LUNGS:  Clear to auscultation bilaterally CHEST:  Unremarkable HEART:  PMI not displaced or sustained,S1 and S2 within normal limits, no S3, no S4, no clicks, no rubs, no murmurs ABD:  Flat, positive bowel sounds normal in frequency in pitch, no bruits, no rebound, no guarding, no midline pulsatile mass, no hepatomegaly, no splenomegaly EXT:  2 plus pulses throughout, no edema, no cyanosis no clubbing  EKG:  Sinus bradycardia, rate 58,  axis within normal limits, intervals within normal limits, no acute ST-T wave changes.  06/23/2014   ASSESSMENT AND PLAN  Palpitation -  He has a variety of symptoms. I will place a 24-hour monitor to screen for any obvious dysrhythmias though I don't strongly suspect anything other than PACs or PVCs that would need to be treated. I will check a TSH.  WPW (Wolff-Parkinson-White syndrome) -  There is no evidence of recurrent dysrhythmias related to this. The plan is as above.  HTN (hypertension) - His BP is normal.  No change in therapy is indicated.

## 2014-06-24 LAB — TSH: TSH: 1.115 u[IU]/mL (ref 0.350–4.500)

## 2014-07-04 ENCOUNTER — Telehealth: Payer: Self-pay | Admitting: Cardiology

## 2014-07-04 NOTE — Telephone Encounter (Signed)
New Msg       Pt calling today states she returned a monitor on Monday and now she is receiving calls that the monitor hasn't been returned.    Please return pt call.

## 2014-07-06 NOTE — Telephone Encounter (Signed)
I have called pt.s wife back and left a message to call me back

## 2014-07-07 NOTE — Telephone Encounter (Signed)
Pt. Called and informed that we have down loaded his monitor and will call him with the results as soon as we get them

## 2014-07-13 ENCOUNTER — Other Ambulatory Visit: Payer: Self-pay | Admitting: *Deleted

## 2014-07-13 ENCOUNTER — Telehealth: Payer: Self-pay | Admitting: Cardiology

## 2014-07-13 DIAGNOSIS — R002 Palpitations: Secondary | ICD-10-CM

## 2014-07-13 NOTE — Telephone Encounter (Signed)
Pt wants to know if his monitor results are back. He had a bad episode last night,please call.

## 2014-07-13 NOTE — Telephone Encounter (Signed)
Informed pt 24hr monitor results not reviewed but we did get download.  Report printed for scanning & review.  He reports same symptoms he has been having with 'extra' beats, had a worse episode last night. He denies CP. Denies dyspnea moreso than previously noted.

## 2014-07-14 NOTE — Telephone Encounter (Signed)
Results in Epic and signed by me.

## 2014-07-18 NOTE — Telephone Encounter (Signed)
Pt. Already called

## 2014-08-09 ENCOUNTER — Telehealth: Payer: Self-pay | Admitting: Cardiology

## 2014-08-09 ENCOUNTER — Emergency Department: Payer: Self-pay | Admitting: Emergency Medicine

## 2014-08-09 NOTE — Telephone Encounter (Signed)
Pt called in stating that he was just released from the ER due to high BP and some fatigue. He was admitted into [facility] and was instructed to f/u with him cardiologist(Dr. Percival Spanish).   He says that if he does not answer the phone, to contact his wife Diane at [phone]  Thanks

## 2014-08-09 NOTE — Telephone Encounter (Signed)
Returned call to patient - no answer. Spoke with wife. She reports patient went via ambulance to [facility] ED last nite.   Patient had symptoms of BP >200/100, dizziness, heart skipping (felt like he could see his heart beat thru his shirt), chest pain, SOB.   Wife reports patient would like to be seen for follow up. Appointment scheduled for 3/28 with L. Dorene Ar, NP @ 9am and Dr. Percival Spanish is in the office this AM as well.   Wife reports patient stated Dr. Percival Spanish had mentioned something about surgery for his heart (r/t WPW?)  Wife thanked Therapist, sports and will inform husband of appointment date/time.

## 2014-08-14 ENCOUNTER — Ambulatory Visit (INDEPENDENT_AMBULATORY_CARE_PROVIDER_SITE_OTHER): Payer: BLUE CROSS/BLUE SHIELD | Admitting: Cardiology

## 2014-08-14 ENCOUNTER — Encounter: Payer: Self-pay | Admitting: Cardiology

## 2014-08-14 VITALS — BP 152/76 | HR 58 | Ht 74.0 in | Wt 199.1 lb

## 2014-08-14 DIAGNOSIS — R0789 Other chest pain: Secondary | ICD-10-CM | POA: Diagnosis not present

## 2014-08-14 DIAGNOSIS — I456 Pre-excitation syndrome: Secondary | ICD-10-CM | POA: Diagnosis not present

## 2014-08-14 DIAGNOSIS — I10 Essential (primary) hypertension: Secondary | ICD-10-CM

## 2014-08-14 DIAGNOSIS — R002 Palpitations: Secondary | ICD-10-CM | POA: Diagnosis not present

## 2014-08-14 NOTE — Progress Notes (Signed)
Cardiology Office Note   Date:  08/14/2014   ID:  Peter Flores, DOB September 10, 1955, MRN [id number]  PCP:  Alonza Bogus, MD  Cardiologist:  Dr. Percival Spanish    Chief Complaint  Patient presents with  . Palpitations    CHEST PAIN TO ER - NONE SINCE,NO SOB, NO SWELLING  . Hospitalization Follow-up    recent visit in ER-FOR PALP , CHEST PAIN , HTN      History of Present Illness: Peter Flores is a 59 y.o. male who presents for post ER visit.  He was seen at [facility] for chest pain and palpitations.   He had a history of WPW years ago. He was having increased palpitations earlier this year. Stress test was unremarkable. He had PVCs on the monitor. Echo was unremarkable. We decided to manage this conservatively. He presents today for evaluation of some palpitations. He has a couple of symptoms. One is that he might feel his heart racing or pounding area and he might feel a little breathless. This doesn't happen when he is physically active but happens after he has stopped what he is doing.  Also feels vibration in chest wall.    Recently wore holter monitor,  +PACs, PVCs and brif a tach at 101, + SB to 40s at night.   He was seen in ER at [facility] for strong palpitations described as tightness then skipped beat.  His chest was moving such strong palpitations.  BP elevated in ER.  He has increased skipped beats after a strenuous day.    Past Medical History  Diagnosis Date  . Hiatal hernia 05/15/1999  . Esophageal reflux 05/15/1999  . Personal history of colonic polyps 05/15/1999    tubulovillous adenoma  . Wolff-Parkinson-White (WPW) syndrome   . Anxiety   . IBS (irritable bowel syndrome)   . Family hx colonic polyps   . Rectal fissure 04/30/1999  . Arthritis   . Hemorrhoids     Past Surgical History  Procedure Laterality Date  . Knee surgery      Bilateral   . Back surgery      x 2   . Colonoscopy    . Polypectomy       Current Outpatient Prescriptions    Medication Sig Dispense Refill  . ALPRAZolam (XANAX) 1 MG tablet Take 0.125 mg by mouth daily.     Marland Kitchen amLODipine (NORVASC) 5 MG tablet Take 1 tablet (5 mg total) by mouth daily. 30 tablet 0  . Cholecalciferol (VITAMIN D PO) Take 1 tablet by mouth daily.    . metoprolol succinate (TOPROL-XL) 25 MG 24 hr tablet TAKE ONE TABLET BY MOUTH EVERY DAY 30 tablet 3  . Omeprazole (PRILOSEC PO) Take 20 mg by mouth daily.     . tadalafil (CIALIS) 10 MG tablet Take 1 tablet (10 mg total) by mouth once a week. As needed for erectile dysfunction 10 tablet 3   No current facility-administered medications for this visit.   Facility-Administered Medications Ordered in Other Visits  Medication Dose Route Frequency Provider Last Rate Last Dose  . 0.45 % sodium chloride infusion   Intravenous Once Rogene Houston, MD        Allergies:   Vicodin    Social History:  The patient  reports that he quit smoking about 33 years ago. He has never used smokeless tobacco. He reports that he does not drink alcohol or use illicit drugs.   Family History:  The patient's family history includes Colon  polyps in his brother; Coronary artery disease (age of onset: 3) in his brother. There is no history of Colon cancer.    ROS:  General:no colds or fevers, no weight changes Skin:no rashes or ulcers HEENT:no blurred vision, no congestion CV:see HPI PUL:see HPI GI:no diarrhea constipation or melena, no indigestion GU:no hematuria, no dysuria MS:no joint pain, no claudication Neuro:no syncope, no lightheadedness Endo:no diabetes, no thyroid disease  Wt Readings from Last 3 Encounters:  08/14/14 199 lb 1.6 oz (90.311 kg)  06/23/14 196 lb 12.8 oz (89.268 kg)  12/02/13 194 lb 12.8 oz (88.361 kg)     PHYSICAL EXAM: VS:  BP 152/76 mmHg  Pulse 58  Ht 6\' 2"  (1.88 m)  Wt 199 lb 1.6 oz (90.311 kg)  BMI 25.55 kg/m2 , BMI Body mass index is 25.55 kg/(m^2). General:Pleasant affect, NAD Skin:Warm and dry, brisk capillary  refill HEENT:normocephalic, sclera clear, mucus membranes moist Neck:supple, no JVD, no bruits  Heart:S1S2 RRR without murmur, gallup, rub or click Lungs:clear without rales, rhonchi, or wheezes GEX:BMWU, non tender, + BS, do not palpate liver spleen or masses Ext:no lower ext edema, 2+ pedal pulses, 2+ radial pulses Neuro:alert and oriented X 3, MAE, follows commands, + facial symmetry    EKG:  EKG is ordered today. The ekg ordered today demonstrates S Brady rate 58 no acute changes from 06/2014.   Recent Labs: 12/02/2013: BUN 11; Creatinine 1.04; Hemoglobin 13.7; Platelets 253; Potassium 4.4; Sodium 138 06/23/2014: TSH 1.115    Lipid Panel No results found for: CHOL, TRIG, HDL, CHOLHDL, VLDL, LDLCALC, LDLDIRECT     Other studies Reviewed: Additional studies/ records that were reviewed today include: ER note.   ASSESSMENT AND PLAN:  Palpitation -  He has a variety of symptoms.TSH was normal, Holter with PACs PVCs and brief atrial tach also SB, cannot increase the BB due to brady.  With increased episodes after increased exertion will proceed with Stress myoview.    WPW (Wolff-Parkinson-White syndrome) -  There is no evidence of recurrent dysrhythmias related to this. But unsure how to control his ectopy that is symptomatic.  Discussed with Dr. Percival Spanish, will refer to EP for assistance in treatment.  At one point he had been on quinidex.   HTN (hypertension) - His BP is elevated will check with stress test, may need to increase norvasc.    Current medicines are reviewed with the patient today.  The patient Has no concerns regarding medicines.  The following changes have been made:  See above Labs/ tests ordered today include:see above  Disposition:   FU:  see above  Signed, Isaiah Serge, NP  08/14/2014 8:55 AM    [facility] [place], [place], [place], [place] Phone: [phone]; Fax:  [phone]

## 2014-08-14 NOTE — Patient Instructions (Signed)
You have been referred to ELECTROPHYSIOLOGIST [facility].(FIRST AVAILABLE)  Your physician has requested that you have en exercise stress myoview. For further information please visit [email or web address]. Please follow instruction sheet, as given.  Your physician wants you to follow-up in [place].  You will receive a reminder letter in the mail two months in advance. If you don't receive a letter, please call our office to schedule the follow-up appointment.

## 2014-08-16 ENCOUNTER — Telehealth (HOSPITAL_COMMUNITY): Payer: Self-pay

## 2014-08-16 NOTE — Telephone Encounter (Signed)
Encounter complete. 

## 2014-08-17 ENCOUNTER — Ambulatory Visit (HOSPITAL_COMMUNITY)
Admission: RE | Admit: 2014-08-17 | Discharge: 2014-08-17 | Disposition: A | Discharge status: Home / Self Care | Payer: BLUE CROSS/BLUE SHIELD | Source: Ambulatory Visit | Attending: Internal Medicine | Admitting: Internal Medicine

## 2014-08-17 DIAGNOSIS — R42 Dizziness and giddiness: Secondary | ICD-10-CM | POA: Diagnosis not present

## 2014-08-17 DIAGNOSIS — I4589 Other specified conduction disorders: Secondary | ICD-10-CM | POA: Diagnosis not present

## 2014-08-17 DIAGNOSIS — Z87891 Personal history of nicotine dependence: Secondary | ICD-10-CM | POA: Diagnosis not present

## 2014-08-17 DIAGNOSIS — E663 Overweight: Secondary | ICD-10-CM | POA: Insufficient documentation

## 2014-08-17 DIAGNOSIS — R0789 Other chest pain: Secondary | ICD-10-CM

## 2014-08-17 DIAGNOSIS — I456 Pre-excitation syndrome: Secondary | ICD-10-CM

## 2014-08-17 DIAGNOSIS — I10 Essential (primary) hypertension: Secondary | ICD-10-CM | POA: Insufficient documentation

## 2014-08-17 DIAGNOSIS — R002 Palpitations: Secondary | ICD-10-CM | POA: Insufficient documentation

## 2014-08-17 DIAGNOSIS — R079 Chest pain, unspecified: Secondary | ICD-10-CM | POA: Insufficient documentation

## 2014-08-17 MED ORDER — TECHNETIUM TC 99M SESTAMIBI GENERIC - CARDIOLITE
10.5000 | Freq: Once | INTRAVENOUS | Status: AC | PRN
  Administered 2014-08-17: 11 via INTRAVENOUS

## 2014-08-17 MED ORDER — TECHNETIUM TC 99M SESTAMIBI GENERIC - CARDIOLITE
31.0000 | Freq: Once | INTRAVENOUS | Status: AC | PRN
  Administered 2014-08-17: 31 via INTRAVENOUS

## 2014-08-17 NOTE — Procedures (Addendum)
[facility] [address] [place] [address] [phone]  Cardiology Nuclear Med Study  Peter Flores is a 59 y.o. male     MRN : [id number]     DOB: 1955/05/30  Procedure Date: 08/17/2014  Nuclear Med Background Indication for Stress Test:  [facility] History:  Sinus Brady--40's;ETT-08/20/2011-normal;WPW;AFIB;No prior NUC MPI for comparison Cardiac Risk Factors: History of Smoking, Hypertension and Overweight  Symptoms:  Chest Pain, Dizziness, Light-Headedness and Palpitations   Nuclear Pre-Procedure Caffeine/Decaff Intake:  7:00pm NPO After: 3:00am   IV Site: R Hand  IV 0.9% NS with Angio Cath:  22g  Chest Size (in):  42" IV Started by: Rolene Course, RN  Height: 6\' 2"  (1.88 m)  Cup Size: n/a  BMI:  Body mass index is 25.54 kg/(m^2). Weight:  199 lb (90.266 kg)   Tech Comments:  n/a    Nuclear Med Study 1 or 2 day study: 1 day  Stress Test Type:  Stress  Order Authorizing Provider:  Minus Breeding, MD   Resting Radionuclide: Technetium 34m Sestamibi  Resting Radionuclide Dose: 10.5 mCi   Stress Radionuclide:  Technetium 44m Sestamibi  Stress Radionuclide Dose: 31.0 mCi           Stress Protocol Rest HR: 53 Stress HR: 142  Rest BP: 140/97 Stress BP: 216/97  Exercise Time (min): 12 METS: 13.4   Predicted Max HR: 160 bpm % Max HR: 88.75 bpm Rate Pressure Product: 92426  Dose of Adenosine (mg):  n/a Dose of Lexiscan: n/a mg  Dose of Atropine (mg): n/a Dose of Dobutamine: n/a mcg/kg/min (at max HR)  Stress Test Technologist: Leane Para, CCT Nuclear Technologist:Elizabeth Young,CNMT   Rest Procedure:  Myocardial perfusion imaging was performed at rest 45 minutes following the intravenous administration of Technetium 61m Sestamibi. Stress Procedure:  The patient performed treadmill exercise using a Bruce  Protocol for 12 minutes. The patient stopped due to Leg/knee discomfort, SOB and denied any chest  pain.  There were no significant ST-T wave changes.  Technetium 50m Sestamibi was injected IV at peak exercise and myocardial perfusion imaging was performed after a brief delay.  Transient Ischemic Dilatation (Normal <1.22):  1.11 QGS EDV:  122 ml QGS ESV:  48 ml LV Ejection Fraction: 61%       Rest ECG: Sinus bradycardia at 56  Stress ECG: No significant change from baseline ECG with rare to occasional PVCs.  QPS Raw Data Images:  Normal; no motion artifact; normal heart/lung ratio. Stress Images:  Normal homogeneous uptake in all areas of the myocardium. Rest Images:  Normal homogeneous uptake in all areas of the myocardium. Subtraction (SDS):  Normal  Impression Exercise Capacity:  Excellent exercise capacity. BP Response:  Hypertensive blood pressure response with peak BP 216/97 at peak stress. Clinical Symptoms:  No significant symptoms noted. ECG Impression:  No significant ST segment change suggestive of ischemia. Comparison with Prior Nuclear Study: No previous nuclear study performed  Overall Impression:  Normal stress nuclear perfusion images. Excellent exercise workload with rare to occasional isolated PVC's and hypertensive BP response to exercise.  LV Wall Motion:  NL LV Function, EF 61%; NL Wall Motion   KELLY,THOMAS A, MD  08/17/2014 12:05 PM

## 2014-09-04 ENCOUNTER — Other Ambulatory Visit: Payer: Self-pay | Admitting: Cardiology

## 2014-09-04 MED ORDER — AMLODIPINE BESYLATE 5 MG PO TABS: 5.0000 mg | ORAL_TABLET | Freq: Every day | ORAL | Status: DC

## 2014-09-04 NOTE — Telephone Encounter (Signed)
°  1. Which medications need to be refilled? Amlodipine   2. Which pharmacy is medication to be sent to?[facility] in [place]   3. Do they need a 30 day or 90 day supply? 30  4. Would they like a call back once the medication has been sent to the pharmacy? Yes

## 2014-09-04 NOTE — Telephone Encounter (Signed)
Rx(s) sent to pharmacy electronically. LM that med was refilled.  

## 2014-09-07 ENCOUNTER — Other Ambulatory Visit (HOSPITAL_COMMUNITY): Payer: Self-pay | Admitting: Pulmonary Disease

## 2014-09-07 DIAGNOSIS — R911 Solitary pulmonary nodule: Secondary | ICD-10-CM

## 2014-09-08 ENCOUNTER — Ambulatory Visit (INDEPENDENT_AMBULATORY_CARE_PROVIDER_SITE_OTHER): Payer: BLUE CROSS/BLUE SHIELD | Admitting: Internal Medicine

## 2014-09-08 ENCOUNTER — Encounter: Payer: Self-pay | Admitting: Internal Medicine

## 2014-09-08 VITALS — BP 126/80 | HR 49 | Ht 74.0 in | Wt 195.6 lb

## 2014-09-08 DIAGNOSIS — I10 Essential (primary) hypertension: Secondary | ICD-10-CM

## 2014-09-08 DIAGNOSIS — R002 Palpitations: Secondary | ICD-10-CM

## 2014-09-08 DIAGNOSIS — I456 Pre-excitation syndrome: Secondary | ICD-10-CM

## 2014-09-08 MED ORDER — FLECAINIDE ACETATE 50 MG PO TABS: 50.0000 mg | ORAL_TABLET | Freq: Two times a day (BID) | ORAL | Status: DC

## 2014-09-08 NOTE — Assessment & Plan Note (Signed)
The mechanism of his palpitations is due to PAC's and PVC's. I have asked that the patient start flecainide 50 mg twice daily. Will uptitrate as needed. Will plan on an exercise test once we know what his final dose is going to be.

## 2014-09-08 NOTE — Assessment & Plan Note (Signed)
His blood pressure is well controlled on metoprolol and amlodipine. Will follow.

## 2014-09-08 NOTE — Patient Instructions (Signed)
Medication Instructions:  Your physician has recommended you make the following change in your medication:  1) Start Flecainide 50 mg twice daily   Labwork: None ordered  Testing/Procedures: None ordered  Follow-Up: Your physician recommends that you schedule a follow-up appointment in: 6 weeks with Dr Lovena Le   Any Other Special Instructions Will Be Listed Below (If Applicable).  No strenuous activity until you after your next follow up

## 2014-09-08 NOTE — Progress Notes (Signed)
HPI [name] is referred today by Dr. Percival Spanish for evaluation of palpitations. He carries a remote h/o WPW but has no ventricular pre-excitation on his ECG's for several years. We do not have any documented SVT. He wore a cardiac monitor and this demonstrated frequent PAC's and PVC's. The patient is bothered by his symptoms. He notes that caffeine withdrawal has reduced his burden of symptoms. He has not had syncope. He has not had any sustained heart racing. He does not have any record of sustained arrhythmias. Previously he has taken quinidine.  Allergies  Allergen Reactions  . Vicodin [Hydrocodone-Acetaminophen] Other (See Comments)    Unknown.     Current Outpatient Prescriptions  Medication Sig Dispense Refill  . ALPRAZolam (XANAX) 1 MG tablet Take 0.125 mg by mouth daily.     Marland Kitchen amLODipine (NORVASC) 5 MG tablet Take 1 tablet (5 mg total) by mouth daily. 30 tablet 10  . Cholecalciferol (VITAMIN D PO) Take 500 mg by mouth daily.     . metoprolol succinate (TOPROL-XL) 25 MG 24 hr tablet TAKE ONE TABLET BY MOUTH EVERY DAY 30 tablet 3  . Omega-3 Fatty Acids (FISH OIL PO) Take 1 tablet by mouth daily.    . Omeprazole (PRILOSEC PO) Take 20 mg by mouth daily.     . tadalafil (CIALIS) 10 MG tablet Take 1 tablet (10 mg total) by mouth once a week. As needed for erectile dysfunction 10 tablet 3  . flecainide (TAMBOCOR) 50 MG tablet Take 1 tablet (50 mg total) by mouth 2 (two) times daily. 180 tablet 3   No current facility-administered medications for this visit.   Facility-Administered Medications Ordered in Other Visits  Medication Dose Route Frequency Provider Last Rate Last Dose  . 0.45 % sodium chloride infusion   Intravenous Once Rogene Houston, MD         Past Medical History  Diagnosis Date  . Hiatal hernia 05/15/1999  . Esophageal reflux 05/15/1999  . Personal history of colonic polyps 05/15/1999    tubulovillous adenoma  . Wolff-Parkinson-White (WPW) syndrome   .  Anxiety   . IBS (irritable bowel syndrome)   . Family hx colonic polyps   . Rectal fissure 04/30/1999  . Arthritis   . Hemorrhoids     ROS:   All systems reviewed and negative except as noted in the HPI.   Past Surgical History  Procedure Laterality Date  . Knee surgery      Bilateral   . Back surgery      x 2   . Colonoscopy    . Polypectomy       Family History  Problem Relation Age of Onset  . Colon polyps Brother   . Colon cancer Neg Hx   . Coronary artery disease Brother 75    Stent  . Heart disease Brother      History   Social History  . Marital Status: Single    Spouse Name: N/A  . Number of Children: 1  . Years of Education: N/A   Occupational History  . Electrician     Social History Main Topics  . Smoking status: Former Smoker    Quit date: 08/05/1981  . Smokeless tobacco: Never Used  . Alcohol Use: No  . Drug Use: No  . Sexual Activity: Not on file   Other Topics Concern  . Not on file   Social History Narrative   2 caffeine drinks daily. (Currently off.)  BP 126/80 mmHg  Pulse 49  Ht 6\' 2"  (1.88 m)  Wt 195 lb 9.6 oz (88.724 kg)  BMI 25.10 kg/m2  Physical Exam:  Well appearing 59 yo man, NAD HEENT: Unremarkable Neck:  No JVD, no thyromegally Lymphatics:  No adenopathy Back:  No CVA tenderness Lungs:  Clear with no wheezes HEART:  Regular rate rhythm, no murmurs, no rubs, no clicks Abd:  soft, positive bowel sounds, no organomegally, no rebound, no guarding Ext:  2 plus pulses, no edema, no cyanosis, no clubbing Skin:  No rashes no nodules Neuro:  CN II through XII intact, motor grossly intact  EKG - sinus bradycardia  Assess/Plan:

## 2014-09-08 NOTE — Assessment & Plan Note (Signed)
He has no evidence any residual accessory pathway conduction and does not have any clear history of sustained SVT.

## 2014-09-11 ENCOUNTER — Ambulatory Visit (HOSPITAL_COMMUNITY)
Admission: RE | Admit: 2014-09-11 | Discharge: 2014-09-11 | Disposition: A | Discharge status: Home / Self Care | Payer: BLUE CROSS/BLUE SHIELD | Source: Ambulatory Visit | Attending: Pulmonary Disease | Admitting: Pulmonary Disease

## 2014-09-11 DIAGNOSIS — R911 Solitary pulmonary nodule: Secondary | ICD-10-CM | POA: Insufficient documentation

## 2014-09-27 ENCOUNTER — Other Ambulatory Visit: Payer: Self-pay

## 2014-09-27 MED ORDER — METOPROLOL SUCCINATE ER 25 MG PO TB24: ORAL_TABLET | ORAL | Status: DC

## 2014-10-10 ENCOUNTER — Telehealth: Payer: Self-pay | Admitting: Internal Medicine

## 2014-10-10 NOTE — Telephone Encounter (Signed)
New Message  Pt wife calling to speak w/ RN c/o of "knots" appearing in pt's neck. Pt has not been feeling quite well since seeing Dr. Lovena Le and starting flecanide- 50 mg. Please call pt back and discuss.

## 2014-10-10 NOTE — Telephone Encounter (Signed)
C/o of painful knots on both sides of his neck x 1 week that are worsening. I reviewed side effect for flecainide and this was not listed as a side effect. Pt was told to see pcp or urgent care as soon as possible. Pt agreed to plan.  1)Pt also c/o nausea every morning and feels it is due to flecainide. Medication regiment was reviewed and pt is taking as prescribed and takes with or after eating. States the nausea goes away after he eats breakfast. Any suggestions to reduce nausea? Pt has f/u app in 1 week.

## 2014-10-12 NOTE — Telephone Encounter (Signed)
Called to check on pt.  Pt went to Urgent Care where he was told he had an infection in his glands, ears,and chest.  Pt given antibiotics and has had some relief.  Pt has had some relief from the nausea also and he found some "nasuea tablets" and has used them from time to time when needed.

## 2014-10-18 ENCOUNTER — Ambulatory Visit (INDEPENDENT_AMBULATORY_CARE_PROVIDER_SITE_OTHER): Payer: BLUE CROSS/BLUE SHIELD | Admitting: Internal Medicine

## 2014-10-18 ENCOUNTER — Encounter: Payer: Self-pay | Admitting: Internal Medicine

## 2014-10-18 VITALS — BP 132/74 | HR 46 | Ht 74.0 in | Wt 195.2 lb

## 2014-10-18 DIAGNOSIS — R002 Palpitations: Secondary | ICD-10-CM

## 2014-10-18 DIAGNOSIS — I10 Essential (primary) hypertension: Secondary | ICD-10-CM

## 2014-10-18 DIAGNOSIS — I456 Pre-excitation syndrome: Secondary | ICD-10-CM | POA: Diagnosis not present

## 2014-10-18 DIAGNOSIS — R0600 Dyspnea, unspecified: Secondary | ICD-10-CM

## 2014-10-18 NOTE — Assessment & Plan Note (Signed)
His blood pressure is reasonably well controlled. He is stopping his metoprolol today. He may require uptitration of amlodipine. He is encouraged to reduce his sodium intake.

## 2014-10-18 NOTE — Assessment & Plan Note (Signed)
His ECG does not demonstrate evidence of pre-excitation. He will continue his flecainide.

## 2014-10-18 NOTE — Progress Notes (Signed)
HPI Peter Flores returns today for followup. He has a h/o abnormal ECG demonstrating WPW pattern but has not had at least recently documented SVT. He has been bothered by symptomatic PAC's and PVC's documented on cardiac monitoring. He was placed on flecainide 50 mg twice daily. He has done well in the interim with a marked reduction in his palpitations. He has had some difficulty with exertion noting some fatigue. No other complaints.  Allergies  Allergen Reactions  . Vicodin [Hydrocodone-Acetaminophen] Other (See Comments)    Unknown.     Current Outpatient Prescriptions  Medication Sig Dispense Refill  . alprazolam (XANAX) 2 MG tablet Take 0.125 mg by mouth daily.  0  . amLODipine (NORVASC) 5 MG tablet Take 1 tablet (5 mg total) by mouth daily. 30 tablet 10  . Cholecalciferol (VITAMIN D PO) Take 500 mg by mouth daily.     . flecainide (TAMBOCOR) 50 MG tablet Take 1 tablet (50 mg total) by mouth 2 (two) times daily. 180 tablet 3  . Omega-3 Fatty Acids (FISH OIL PO) Take 1 tablet by mouth daily.    . Omeprazole (PRILOSEC PO) Take 20 mg by mouth daily.     . tadalafil (CIALIS) 10 MG tablet Take 1 tablet (10 mg total) by mouth once a week. As needed for erectile dysfunction 10 tablet 3   No current facility-administered medications for this visit.   Facility-Administered Medications Ordered in Other Visits  Medication Dose Route Frequency Provider Last Rate Last Dose  . 0.45 % sodium chloride infusion   Intravenous Once Rogene Houston, MD         Past Medical History  Diagnosis Date  . Hiatal hernia 05/15/1999  . Esophageal reflux 05/15/1999  . Personal history of colonic polyps 05/15/1999    tubulovillous adenoma  . Wolff-Parkinson-White (WPW) syndrome   . Anxiety   . IBS (irritable bowel syndrome)   . Family hx colonic polyps   . Rectal fissure 04/30/1999  . Arthritis   . Hemorrhoids     ROS:   All systems reviewed and negative except as noted in the  HPI.   Past Surgical History  Procedure Laterality Date  . Knee surgery      Bilateral   . Back surgery      x 2   . Colonoscopy    . Polypectomy       Family History  Problem Relation Age of Onset  . Colon polyps Brother   . Colon cancer Neg Hx   . Coronary artery disease Brother 60    Stent  . Heart disease Brother      History   Social History  . Marital Status: Single    Spouse Name: N/A  . Number of Children: 1  . Years of Education: N/A   Occupational History  . Electrician     Social History Main Topics  . Smoking status: Former Smoker    Quit date: 08/05/1981  . Smokeless tobacco: Never Used  . Alcohol Use: No  . Drug Use: No  . Sexual Activity: Not on file   Other Topics Concern  . Not on file   Social History Narrative   2 caffeine drinks daily. (Currently off.)     BP 132/74 mmHg  Pulse 46  Ht 6\' 2"  (1.88 m)  Wt 195 lb 3.2 oz (88.542 kg)  BMI 25.05 kg/m2  Physical Exam:  Well appearing 59 yo man, NAD HEENT: Unremarkable Neck:  No JVD,  no thyromegally Back:  No CVA tenderness Lungs:  Clear with no wheezes HEART:  Regular rate rhythm, no murmurs, no rubs, no clicks Abd:  soft, positive bowel sounds, no organomegally, no rebound, no guarding Ext:  2 plus pulses, no edema, no cyanosis, no clubbing Skin:  No rashes no nodules Neuro:  CN II through XII intact, motor grossly intact  EKG - sinus bradycardia, QRS 110.  Assess/Plan:

## 2014-10-18 NOTE — Assessment & Plan Note (Signed)
His symptoms are much improved. He will continue low dose flecainide. He will reduce his dose of metoprolol.

## 2014-10-18 NOTE — Assessment & Plan Note (Signed)
He has had some fatigue. He is bradycardic. I have asked him to stop his beta blocker.

## 2014-10-18 NOTE — Patient Instructions (Signed)
Medication Instructions:  Your physician has recommended you make the following change in your medication:  1) STOP Metoprolol - call our office if your blood pressure increases   Labwork: NONE  Testing/Procedures: NONE  Follow-Up: Your physician wants you to follow-up in: 6 months with Dr.Taylor. You will receive a reminder letter in the mail two months in advance. If you don't receive a letter, please call our office to schedule the follow-up appointment.   Any Other Special Instructions Will Be Listed Below (If Applicable).

## 2014-10-30 ENCOUNTER — Telehealth: Payer: Self-pay | Admitting: Internal Medicine

## 2014-10-30 DIAGNOSIS — I456 Pre-excitation syndrome: Secondary | ICD-10-CM

## 2014-10-30 DIAGNOSIS — R0602 Shortness of breath: Secondary | ICD-10-CM

## 2014-10-30 NOTE — Telephone Encounter (Signed)
Patient st that since starting Flecainide a few weeks ago, he has experienced SOB on exertion.  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 He st he even felt like he was going to pass out this weekend while working. He sat down and symptoms subsided. Patient is unable to check his HR, but st when he gets SOB on exertion his HR may be elevated. Patient is not in any distress now and his symptoms are not worsening (he st he has had to limit his activity the entire time he has been on Flecainide). Instructed the patient to take it easy and avoid strenuous activity. That Dr. Lovena Le is not in the office today but his nurse will follow-up with him and any medication changes in a couple days.  To Dr. Lovena Le for recommendations.

## 2014-10-30 NOTE — Telephone Encounter (Signed)
Pt on new med FLECANIDE 50MG  TWICE A DAY -and when he works hard he gets SOB-almost passed out [place]

## 2014-11-01 NOTE — Telephone Encounter (Signed)
Follow Up       Pt's wife calling to follow up on previous message and wants to make sure that they hear back about it today. Please call pt back and advise.

## 2014-11-02 NOTE — Telephone Encounter (Signed)
Discussed with Dr Lovena Le and will get a GXT with the new start of Flecainide and now having SOB.  Patient is aware and order is in system

## 2014-11-03 ENCOUNTER — Ambulatory Visit (HOSPITAL_COMMUNITY)
Admission: RE | Admit: 2014-11-03 | Discharge: 2014-11-03 | Disposition: A | Discharge status: Home / Self Care | Payer: BLUE CROSS/BLUE SHIELD | Source: Ambulatory Visit | Attending: Internal Medicine | Admitting: Internal Medicine

## 2014-11-03 DIAGNOSIS — I456 Pre-excitation syndrome: Secondary | ICD-10-CM | POA: Diagnosis not present

## 2014-11-03 DIAGNOSIS — R0602 Shortness of breath: Secondary | ICD-10-CM | POA: Diagnosis not present

## 2014-11-03 LAB — EXERCISE TOLERANCE TEST
CHL RATE OF PERCEIVED EXERTION: 13
Estimated workload: 11.7 METS
Exercise duration (min): 10 min
Exercise duration (sec): 0 s
MPHR: 160 {beats}/min
Peak HR: 141 {beats}/min
Percent HR: 88 %
Rest HR: 63 {beats}/min

## 2014-11-09 NOTE — Addendum Note (Signed)
Addended by: Marion Downer A on: 11/09/2014 11:05 AM   Modules accepted: Orders

## 2014-12-06 LAB — BASIC METABOLIC PANEL
Anion Gap: 5 — ABNORMAL LOW (ref 7–16)
BUN: 15 mg/dL
CALCIUM: 9.5 mg/dL
CHLORIDE: 106 mmol/L
CO2: 28 mmol/L
Creatinine: 0.96 mg/dL
EGFR (African American): >60
EGFR (Non-African Amer.): >60
Glucose: 179 mg/dL — ABNORMAL HIGH
Potassium: 3.6 mmol/L
SODIUM: 139 mmol/L

## 2014-12-06 LAB — CBC
HCT: 39.4 % — AB (ref 40.0–52.0)
HGB: 13.2 g/dL (ref 13.0–18.0)
MCH: 30.2 pg (ref 26.0–34.0)
MCHC: 33.4 g/dL (ref 32.0–36.0)
MCV: 90 fL (ref 80–100)
Platelet: 190 10*3/uL (ref 150–440)
RBC: 4.37 10*6/uL — AB (ref 4.40–5.90)
RDW: 12.4 % (ref 11.5–14.5)
WBC: 4.3 10*3/uL (ref 3.8–10.6)

## 2014-12-06 LAB — TROPONIN I

## 2015-01-30 ENCOUNTER — Ambulatory Visit: Admit: 2015-01-30 | Discharge: 2015-01-30 | Payer: PRIVATE HEALTH INSURANCE

## 2015-01-30 DIAGNOSIS — L570 Actinic keratosis: Secondary | ICD-10-CM

## 2015-01-30 MED ORDER — fluorouracil (EFUDEX) 5 % cream
5 | TOPICAL | Status: AC
Start: 2015-01-30 — End: ?

## 2015-01-30 NOTE — Unmapped (Signed)
Subjective  HPI:   Patient ID: Logan Miller is a 59 y.o. male.    Chief Complaint:  Chief Complaint   Patient presents with   ??? Skin Lesion     FSE, PHx of NMSC     Complaining of: few round, red, scaly spots, location: on the L cheek, duration: few months, associated symptoms: itching, prior treatments: treated in the past with LN2, no other associated symptoms or modifying factors.     Past Medical History   Diagnosis Date   ??? Basal cell carcinoma    Allergies: NKDA.   Social history: lives in [place], non smoker, bus driver.     ROS:   Review of Systems  No cough, no fever, no headache and no skin rash.   Objective:   Physical Exam  Derm Physical Exam:  Examination was performed of the following were unremarkable: scalp/hair, conjunctivae/eyelids, gums/teeth/lips, neck, breast/axilla/chest, abdomen, back, RUE, LUE, RLE, LLE and genitalia/groin/buttocks    Abnormalities noted include: head/face multiple ill defined, round, erythematous, 2-109mm scaly papules on the anterior and lateral L cheek.   There is a vertical preauricular scar on the L cheek.   Assessment/Plan:     Actinic keratosis on the L cheek with past history of basal cell carcinoma. Discussed treatment options.     Efudex apply once every night to the L cheek for 4-5 weeks.   Edu re: blistering and irritation.     The nature of sun-induced photo-aging and skin cancers is discussed.  Sun avoidance, protective clothing, and the use of 30-SPF sunscreens is advised. Observe closely for skin damage/changes, and call if such occurs.

## 2015-05-29 ENCOUNTER — Ambulatory Visit: Payer: PRIVATE HEALTH INSURANCE

## 2015-06-05 ENCOUNTER — Encounter: Payer: Self-pay | Admitting: Cardiovascular Disease

## 2015-07-06 ENCOUNTER — Encounter: Payer: Self-pay | Admitting: Internal Medicine

## 2015-07-06 ENCOUNTER — Ambulatory Visit (INDEPENDENT_AMBULATORY_CARE_PROVIDER_SITE_OTHER): Payer: BLUE CROSS/BLUE SHIELD | Admitting: Internal Medicine

## 2015-07-06 VITALS — BP 144/92 | HR 57 | Ht 74.0 in | Wt 199.2 lb

## 2015-07-06 DIAGNOSIS — R002 Palpitations: Secondary | ICD-10-CM | POA: Diagnosis not present

## 2015-07-06 DIAGNOSIS — I10 Essential (primary) hypertension: Secondary | ICD-10-CM

## 2015-07-06 DIAGNOSIS — I456 Pre-excitation syndrome: Secondary | ICD-10-CM

## 2015-07-06 MED ORDER — FLECAINIDE ACETATE 50 MG PO TABS: 50.0000 mg | ORAL_TABLET | Freq: Two times a day (BID) | ORAL | Status: DC

## 2015-07-06 MED ORDER — AMLODIPINE BESYLATE 5 MG PO TABS: 5.0000 mg | ORAL_TABLET | Freq: Every day | ORAL | Status: DC

## 2015-07-06 NOTE — Patient Instructions (Signed)
Medication Instructions:  Your physician recommends that you continue on your current medications as directed. Please refer to the Current Medication list given to you today.  Labwork: None ordered  Testing/Procedures: None ordered  Follow-Up: Your physician wants you to follow-up in: 1 year with Dr. Taylor. You will receive a reminder letter in the mail two months in advance. If you don't receive a letter, please call our office to schedule the follow-up appointment.  If you need a refill on your cardiac medications before your next appointment, please call your pharmacy.  Thank you for choosing [facility]!!        

## 2015-07-06 NOTE — Progress Notes (Signed)
HPI [name] returns today for followup. He has a h/o abnormal ECG demonstrating WPW pattern but has not had at least recently documented SVT. He had been bothered by symptomatic PAC's and PVC's documented on cardiac monitoring. He was placed on flecainide 50 mg twice daily. He has done well in the interim. He notes occaisional breakthroughs of palpitations. He has non-exertional chest pain. His blood pressure has been well controlled. No syncope or sustained heart racing.  Allergies  Allergen Reactions  . Vicodin [Hydrocodone-Acetaminophen] Other (See Comments)    Unknown.     Current Outpatient Prescriptions  Medication Sig Dispense Refill  . alprazolam (XANAX) 2 MG tablet Take 0.125 mg by mouth at bedtime as needed for sleep or anxiety.    Marland Kitchen amLODipine (NORVASC) 5 MG tablet Take 1 tablet (5 mg total) by mouth daily. 30 tablet 10  . Cholecalciferol (VITAMIN D PO) Take 500 mg by mouth daily.     . flecainide (TAMBOCOR) 50 MG tablet Take 1 tablet (50 mg total) by mouth 2 (two) times daily. 180 tablet 3  . Omega-3 Fatty Acids (FISH OIL PO) Take 1 tablet by mouth daily.    . Omeprazole (PRILOSEC PO) Take 20 mg by mouth daily.     . tadalafil (CIALIS) 10 MG tablet Take 1 tablet (10 mg total) by mouth once a week. As needed for erectile dysfunction 10 tablet 3   No current facility-administered medications for this visit.   Facility-Administered Medications Ordered in Other Visits  Medication Dose Route Frequency Provider Last Rate Last Dose  . 0.45 % sodium chloride infusion   Intravenous Once Rogene Houston, MD         Past Medical History  Diagnosis Date  . Hiatal hernia 05/15/1999  . Esophageal reflux 05/15/1999  . Personal history of colonic polyps 05/15/1999    tubulovillous adenoma  . Wolff-Parkinson-White (WPW) syndrome   . Anxiety   . IBS (irritable bowel syndrome)   . Family hx colonic polyps   . Rectal fissure 04/30/1999  . Arthritis   . Hemorrhoids      ROS:   All systems reviewed and negative except as noted in the HPI.   Past Surgical History  Procedure Laterality Date  . Knee surgery      Bilateral   . Back surgery      x 2   . Colonoscopy    . Polypectomy       Family History  Problem Relation Age of Onset  . Colon polyps Brother   . Colon cancer Neg Hx   . Coronary artery disease Brother 37    Stent  . Heart disease Brother      Social History   Social History  . Marital Status: Married    Spouse Name: N/A  . Number of Children: 1  . Years of Education: N/A   Occupational History  . Electrician     Social History Main Topics  . Smoking status: Former Smoker    Quit date: 08/05/1981  . Smokeless tobacco: Never Used  . Alcohol Use: No  . Drug Use: No  . Sexual Activity: Not on file   Other Topics Concern  . Not on file   Social History Narrative   2 caffeine drinks daily. (Currently off.)     BP 144/92 mmHg  Pulse 57  Ht 6\' 2"  (1.88 m)  Wt 199 lb 3.2 oz (90.357 kg)  BMI 25.57 kg/m2  Physical Exam:  Well appearing 60 yo man, NAD HEENT: Unremarkable Neck:  6 cm JVD, no thyromegally Back:  No CVA tenderness Lungs:  Clear with no wheezes HEART:  Regular rate rhythm, no murmurs, no rubs, no clicks Abd:  soft, positive bowel sounds, no organomegally, no rebound, no guarding Ext:  2 plus pulses, no edema, no cyanosis, no clubbing Skin:  No rashes no nodules Neuro:  CN II through XII intact, motor grossly intact  EKG - sinus bradycardia, QRS 108  Assess/Plan: 1. WPW - he is doing well, s/p initiation of flecainide 2. PVC's and PAC's - his symptoms are much improved on flecainide. For breakthru, I have instructed him to take an extra dose or two as needed. 3. Chest pain - this is non-exertional. He has done much heavy lifting and strenuous sexual activity over the past few months with no chest pain. 4. HTN - his blood pressure is elevated today but he notes that at home it is  controlled.  Mikle Bosworth.D.

## 2015-07-10 ENCOUNTER — Ambulatory Visit: Admit: 2015-07-10 | Discharge: 2015-07-10 | Payer: PRIVATE HEALTH INSURANCE

## 2015-07-10 DIAGNOSIS — L570 Actinic keratosis: Secondary | ICD-10-CM

## 2015-07-10 NOTE — Unmapped (Signed)
Subjective  HPI:   Patient ID: Logan Miller is a 60 y.o. male.    Chief Complaint:  Chief Complaint   Patient presents with   ??? Skin Lesion     facial skin check     Complaining of: few round, red, scaly spots, location: on the L cheek, duration: few months, associated symptoms: itching, prior treatments: treated in the past with LN2 and efudex, no other associated symptoms or modifying factors.     Past Medical History   Diagnosis Date   ??? Basal cell carcinoma    ??? Actinic keratosis    Allergies: NKDA.   Social history: lives in [place], non smoker, bus driver.     ROS:   Review of Systems    No cough, no fever, no headache and no skin rash.   Objective:   Physical Exam    Derm Physical Exam:  Examination was performed of the following were unremarkable: scalp/hair, conjunctivae/eyelids, gums/teeth/lips, neck, RUE and LUE    Abnormalities noted include: head/face  Ill defined, round, erythematous 3mm papules on the nose, and L cheek.     Assessment/Plan:     Actinic keratosis on the nose and L cheek with past history of basal cell carcinoma. Discussed treatment options.   Treated with Cryotherapy x 5 secs  each x 3 lesions total.  Pt was counselled on procedure method, risks (to include pain, burning, stinging, blistering, discoloration, recurrence of lesion, or incomplete removal of lesion), benefits, and methods of care for site (avoid popping blisters; if blister pops, treat with an otc healing oint such as vaseline and cover with bandaid, change dressing daily until healed; avoid picking or scratching lesions).  Pt was advised to follow up if lesion(s) is/are not resolved after healed in 2-4 weeks or if lesion(s) recur.  Pt was advised to call clinic or follow up sooner if worsening pain, blistering, bleeding, or any other significant problem occurs.     Stop Efudex  The nature of sun-induced photo-aging and skin cancers is discussed.  Sun avoidance, protective clothing, and the use of 30-SPF sunscreens is  advised. Observe closely for skin damage/changes, and call if such occurs.    Follow up 6 months.

## 2015-09-10 ENCOUNTER — Telehealth: Payer: Self-pay

## 2015-09-10 DIAGNOSIS — R002 Palpitations: Secondary | ICD-10-CM

## 2015-09-10 MED ORDER — FLECAINIDE ACETATE 50 MG PO TABS: ORAL_TABLET | ORAL | Status: DC

## 2015-09-10 NOTE — Telephone Encounter (Signed)
Patient is now taking 100mg  in the am and 50 mg in the pm. He did this about 2 weeks ago as his heart started skipping and he is feeling much better now. Says Dr Lovena Le told him if he continued to have palpitations and skipping he could do this

## 2016-01-08 ENCOUNTER — Ambulatory Visit: Admit: 2016-01-08 | Discharge: 2016-01-08 | Payer: PRIVATE HEALTH INSURANCE

## 2016-01-08 DIAGNOSIS — L570 Actinic keratosis: Secondary | ICD-10-CM

## 2016-01-08 NOTE — Unmapped (Signed)
Subjective  HPI:   Patient ID: Logan Miller is a 60 y.o. male.    Chief Complaint:  Chief Complaint   Patient presents with   ??? Actinic Keratosis     Complaining of: few round, red, scaly spots, location: on the L cheek, duration: few months, associated symptoms: itching, prior treatments: treated in the past with LN2 and efudex, no other associated symptoms or modifying factors.     Past Medical History   Diagnosis Date   ??? Basal cell carcinoma    ??? Actinic keratosis    Allergies: NKDA.   Social history: lives in [place], non smoker, bus driver.     ROS:   Review of Systems    No cough, no fever, no headache and no skin rash.   Objective:   Physical Exam    Derm Physical Exam:  Examination was performed of the following were unremarkable: scalp/hair, conjunctivae/eyelids, gums/teeth/lips, neck, RUE and LUE    Abnormalities noted include: head/face  Ill defined, round, erythematous 3mm papules on the scalp and L cheek.     Assessment/Plan:     Actinic keratosis with past history of basal cell carcinoma. Discussed treatment options.   Treated with Cryotherapy x 5 secs  each x 5 lesions total.  Pt was counselled on procedure method, risks (to include pain, burning, stinging, blistering, discoloration, recurrence of lesion, or incomplete removal of lesion), benefits, and methods of care for site (avoid popping blisters; if blister pops, treat with an otc healing oint such as vaseline and cover with bandaid, change dressing daily until healed; avoid picking or scratching lesions).  Pt was advised to follow up if lesion(s) is/are not resolved after healed in 2-4 weeks or if lesion(s) recur.  Pt was advised to call clinic or follow up sooner if worsening pain, blistering, bleeding, or any other significant problem occurs.     He has Efudex at home, keep medication on hold at this time.     The nature of sun-induced photo-aging and skin cancers is discussed.  Sun avoidance, protective clothing, and the use of 30-SPF  sunscreens is advised. Observe closely for skin damage/changes, and call if such occurs.    Follow up 6 months.

## 2016-01-11 DIAGNOSIS — K210 Gastro-esophageal reflux disease with esophagitis: Secondary | ICD-10-CM | POA: Diagnosis not present

## 2016-01-11 DIAGNOSIS — I456 Pre-excitation syndrome: Secondary | ICD-10-CM | POA: Diagnosis not present

## 2016-01-11 DIAGNOSIS — F419 Anxiety disorder, unspecified: Secondary | ICD-10-CM | POA: Diagnosis not present

## 2016-01-11 DIAGNOSIS — J984 Other disorders of lung: Secondary | ICD-10-CM | POA: Diagnosis not present

## 2016-01-15 ENCOUNTER — Other Ambulatory Visit (HOSPITAL_COMMUNITY): Payer: Self-pay | Admitting: Pulmonary Disease

## 2016-01-15 DIAGNOSIS — R911 Solitary pulmonary nodule: Secondary | ICD-10-CM

## 2016-01-31 ENCOUNTER — Ambulatory Visit (HOSPITAL_COMMUNITY): Payer: BLUE CROSS/BLUE SHIELD

## 2016-02-15 ENCOUNTER — Ambulatory Visit (HOSPITAL_COMMUNITY): Payer: BLUE CROSS/BLUE SHIELD

## 2016-03-03 ENCOUNTER — Ambulatory Visit (HOSPITAL_COMMUNITY)
Admission: RE | Admit: 2016-03-03 | Discharge: 2016-03-03 | Disposition: A | Discharge status: Home / Self Care | Payer: BLUE CROSS/BLUE SHIELD | Source: Ambulatory Visit | Attending: Pulmonary Disease | Admitting: Pulmonary Disease

## 2016-03-03 DIAGNOSIS — R918 Other nonspecific abnormal finding of lung field: Secondary | ICD-10-CM | POA: Insufficient documentation

## 2016-03-03 DIAGNOSIS — R911 Solitary pulmonary nodule: Secondary | ICD-10-CM | POA: Diagnosis present

## 2016-03-20 ENCOUNTER — Telehealth: Payer: Self-pay | Admitting: Internal Medicine

## 2016-03-20 NOTE — Telephone Encounter (Signed)
flecainide (TAMBOCOR) 50 MG tablet  Medication  Date: 09/10/2015 Department: [facility] Ordering/Authorizing: Evans Lance, MD  Order Providers   Prescribing Provider Encounter Provider  Evans Lance, MD Garret Reddish, CMA  Medication Detail    Disp Refills Start End   flecainide (TAMBOCOR) 50 MG tablet 270 tablet 3 09/10/2015    Sig: Take 100mg  in the am and 50 mg in the pm   E-Prescribing Status: Receipt confirmed by pharmacy (09/10/2015 11:34 AM EDT)   Associated Diagnoses   Palpitation - [place], [place] [id number]

## 2016-03-20 NOTE — Telephone Encounter (Signed)
flecainide (TAMBOCOR) 50 MG tablet   Please send refill request to [facility]  Call patient due to unsure on how to medication before filling refill request

## 2016-03-20 NOTE — Telephone Encounter (Signed)
Spoke with patient and made him aware that he has refills remaining at [facility]. He will call them and call us back if he needs further assistance.

## 2016-03-21 ENCOUNTER — Other Ambulatory Visit: Payer: Self-pay | Admitting: Internal Medicine

## 2016-03-21 DIAGNOSIS — R002 Palpitations: Secondary | ICD-10-CM

## 2016-04-24 DIAGNOSIS — L720 Epidermal cyst: Secondary | ICD-10-CM | POA: Diagnosis not present

## 2016-04-24 DIAGNOSIS — B078 Other viral warts: Secondary | ICD-10-CM | POA: Diagnosis not present

## 2016-04-24 DIAGNOSIS — D2239 Melanocytic nevi of other parts of face: Secondary | ICD-10-CM | POA: Diagnosis not present

## 2016-04-24 DIAGNOSIS — D2339 Other benign neoplasm of skin of other parts of face: Secondary | ICD-10-CM | POA: Diagnosis not present

## 2016-06-02 ENCOUNTER — Other Ambulatory Visit: Payer: Self-pay | Admitting: Internal Medicine

## 2016-06-02 DIAGNOSIS — I10 Essential (primary) hypertension: Secondary | ICD-10-CM

## 2016-06-23 DIAGNOSIS — M546 Pain in thoracic spine: Secondary | ICD-10-CM | POA: Diagnosis not present

## 2016-07-01 ENCOUNTER — Telehealth: Payer: Self-pay | Admitting: Internal Medicine

## 2016-07-01 NOTE — Telephone Encounter (Signed)
New message       Talk to someone in device clinic.  Wife states that pt's heart is skipping beats.

## 2016-07-03 DIAGNOSIS — J40 Bronchitis, not specified as acute or chronic: Secondary | ICD-10-CM | POA: Diagnosis not present

## 2016-07-03 DIAGNOSIS — J111 Influenza due to unidentified influenza virus with other respiratory manifestations: Secondary | ICD-10-CM | POA: Diagnosis not present

## 2016-07-08 ENCOUNTER — Ambulatory Visit: Payer: PRIVATE HEALTH INSURANCE

## 2016-07-11 DIAGNOSIS — R3911 Hesitancy of micturition: Secondary | ICD-10-CM | POA: Diagnosis not present

## 2016-07-11 DIAGNOSIS — N5201 Erectile dysfunction due to arterial insufficiency: Secondary | ICD-10-CM | POA: Diagnosis not present

## 2016-07-16 ENCOUNTER — Ambulatory Visit (INDEPENDENT_AMBULATORY_CARE_PROVIDER_SITE_OTHER): Payer: BLUE CROSS/BLUE SHIELD | Admitting: Internal Medicine

## 2016-07-16 ENCOUNTER — Encounter: Payer: Self-pay | Admitting: Internal Medicine

## 2016-07-16 VITALS — BP 116/80 | HR 57 | Ht 73.0 in | Wt 203.4 lb

## 2016-07-16 DIAGNOSIS — I456 Pre-excitation syndrome: Secondary | ICD-10-CM

## 2016-07-16 DIAGNOSIS — I10 Essential (primary) hypertension: Secondary | ICD-10-CM

## 2016-07-16 DIAGNOSIS — R002 Palpitations: Secondary | ICD-10-CM | POA: Diagnosis not present

## 2016-07-16 MED ORDER — FLECAINIDE ACETATE 50 MG PO TABS: ORAL_TABLET | ORAL | 3 refills | Status: DC

## 2016-07-16 MED ORDER — AMLODIPINE BESYLATE 5 MG PO TABS: 5.0000 mg | ORAL_TABLET | Freq: Every day | ORAL | 3 refills | Status: DC

## 2016-07-16 NOTE — Progress Notes (Signed)
HPI [name] returns today for followup. He has a h/o abnormal ECG demonstrating WPW pattern but has not had at least recently documented SVT. He had been bothered by symptomatic PAC's and PVC's documented on cardiac monitoring. He was placed on flecainide 50 mg twice daily. He has done well in the interim. He notes occaisional breakthroughs of palpitations with exertion. He has non-exertional chest pain. His blood pressure has been well controlled. No syncope or sustained heart racing. He is working as an Clinical biochemist.  Allergies  Allergen Reactions  . Vicodin [Hydrocodone-Acetaminophen] Other (See Comments)    Unknown.     Current Outpatient Prescriptions  Medication Sig Dispense Refill  . alprazolam (XANAX) 2 MG tablet Take 0.125 mg by mouth at bedtime as needed for sleep or anxiety.    Marland Kitchen amLODipine (NORVASC) 5 MG tablet Take 1 tablet (5 mg total) by mouth daily. *Please call and schedule a one year follow up appointment* 30 tablet 0  . Cholecalciferol (VITAMIN D PO) Take 500 mg by mouth daily.     . flecainide (TAMBOCOR) 50 MG tablet Take 100mg  in the am and 50 mg in the pm 270 tablet 3  . Omega-3 Fatty Acids (FISH OIL PO) Take 1 tablet by mouth daily.    . Omeprazole (PRILOSEC PO) Take 20 mg by mouth daily.     . tadalafil (CIALIS) 10 MG tablet Take 1 tablet (10 mg total) by mouth once a week. As needed for erectile dysfunction 10 tablet 3   No current facility-administered medications for this visit.    Facility-Administered Medications Ordered in Other Visits  Medication Dose Route Frequency Provider Last Rate Last Dose  . 0.45 % sodium chloride infusion   Intravenous Once Peter Houston, MD         Past Medical History:  Diagnosis Date  . Anxiety   . Arthritis   . Esophageal reflux 05/15/1999  . Family hx colonic polyps   . Hemorrhoids   . Hiatal hernia 05/15/1999  . IBS (irritable bowel syndrome)   . 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 Rectal fissure 04/30/1999  . Wolff-Parkinson-White (WPW) syndrome     ROS:   All systems reviewed and negative except as noted in the HPI.   Past Surgical History:  Procedure Laterality Date  . BACK SURGERY     x 2   . COLONOSCOPY    . KNEE SURGERY     Bilateral   . POLYPECTOMY       Family History  Problem Relation Age of Onset  . Colon polyps Brother   . Coronary artery disease Brother 60    Stent  . Heart disease Brother   . Colon cancer Neg Hx      Social History   Social History  . Marital status: Married    Spouse name: N/A  . Number of children: 1  . Years of education: N/A   Occupational History  . Electrician  Ambulance person   Social History Main Topics  . Smoking status: Former Smoker    Quit date: 08/05/1981  . Smokeless tobacco: Never Used  . Alcohol use No  . Drug use: No  . Sexual activity: Not on file   Other Topics Concern  . Not on file   Social History Narrative   2 caffeine drinks daily. (Currently off.)     BP 116/80   Pulse (!) 57   Ht 6\' 1"  (1.854  m)   Wt 203 lb 6.4 oz (92.3 kg)   SpO2 99%   BMI 26.84 kg/m   Physical Exam:  Well appearing 61 yo man, NAD HEENT: Unremarkable Neck:  6 cm JVD, no thyromegally Back:  No CVA tenderness Lungs:  Clear with no wheezes HEART:  Regular rate rhythm, no murmurs, no rubs, no clicks Abd:  soft, positive bowel sounds, no organomegally, no rebound, no guarding Ext:  2 plus pulses, no edema, no cyanosis, no clubbing Skin:  No rashes no nodules Neuro:  CN II through XII intact, motor grossly intact  EKG - sinus bradycardia, QRS 116, no pre-excitation  Assess/Plan: 1. WPW - he is doing well on flecainide and his WPW appears to be well controlled. 2. PVC's and PAC's - his symptoms are much improved on flecainide. For breakthru, I have instructed him to take an extra dose or two as needed. 3.  HTN - his blood pressure is well controlled today. He will continue  a low sodium diet.  Peter Flores.D.

## 2016-07-16 NOTE — Patient Instructions (Signed)

## 2016-12-18 DIAGNOSIS — E748 Other specified disorders of carbohydrate metabolism: Secondary | ICD-10-CM | POA: Diagnosis not present

## 2016-12-18 DIAGNOSIS — Z1389 Encounter for screening for other disorder: Secondary | ICD-10-CM | POA: Diagnosis not present

## 2017-02-22 IMAGING — CR DG CHEST 2V
1 series · 2 of 2 positions shown · non-contrast
Comparison: Chest CT 06/15/2013

CLINICAL DATA: Central chest pain, dizziness and nausea for 4
hours.

EXAM:
CHEST  2 VIEW

[Series 1: dxr chest pa (or ap) and lateral · 0.14mm/px · 2 of 2 slices shown]
[im 1/2]
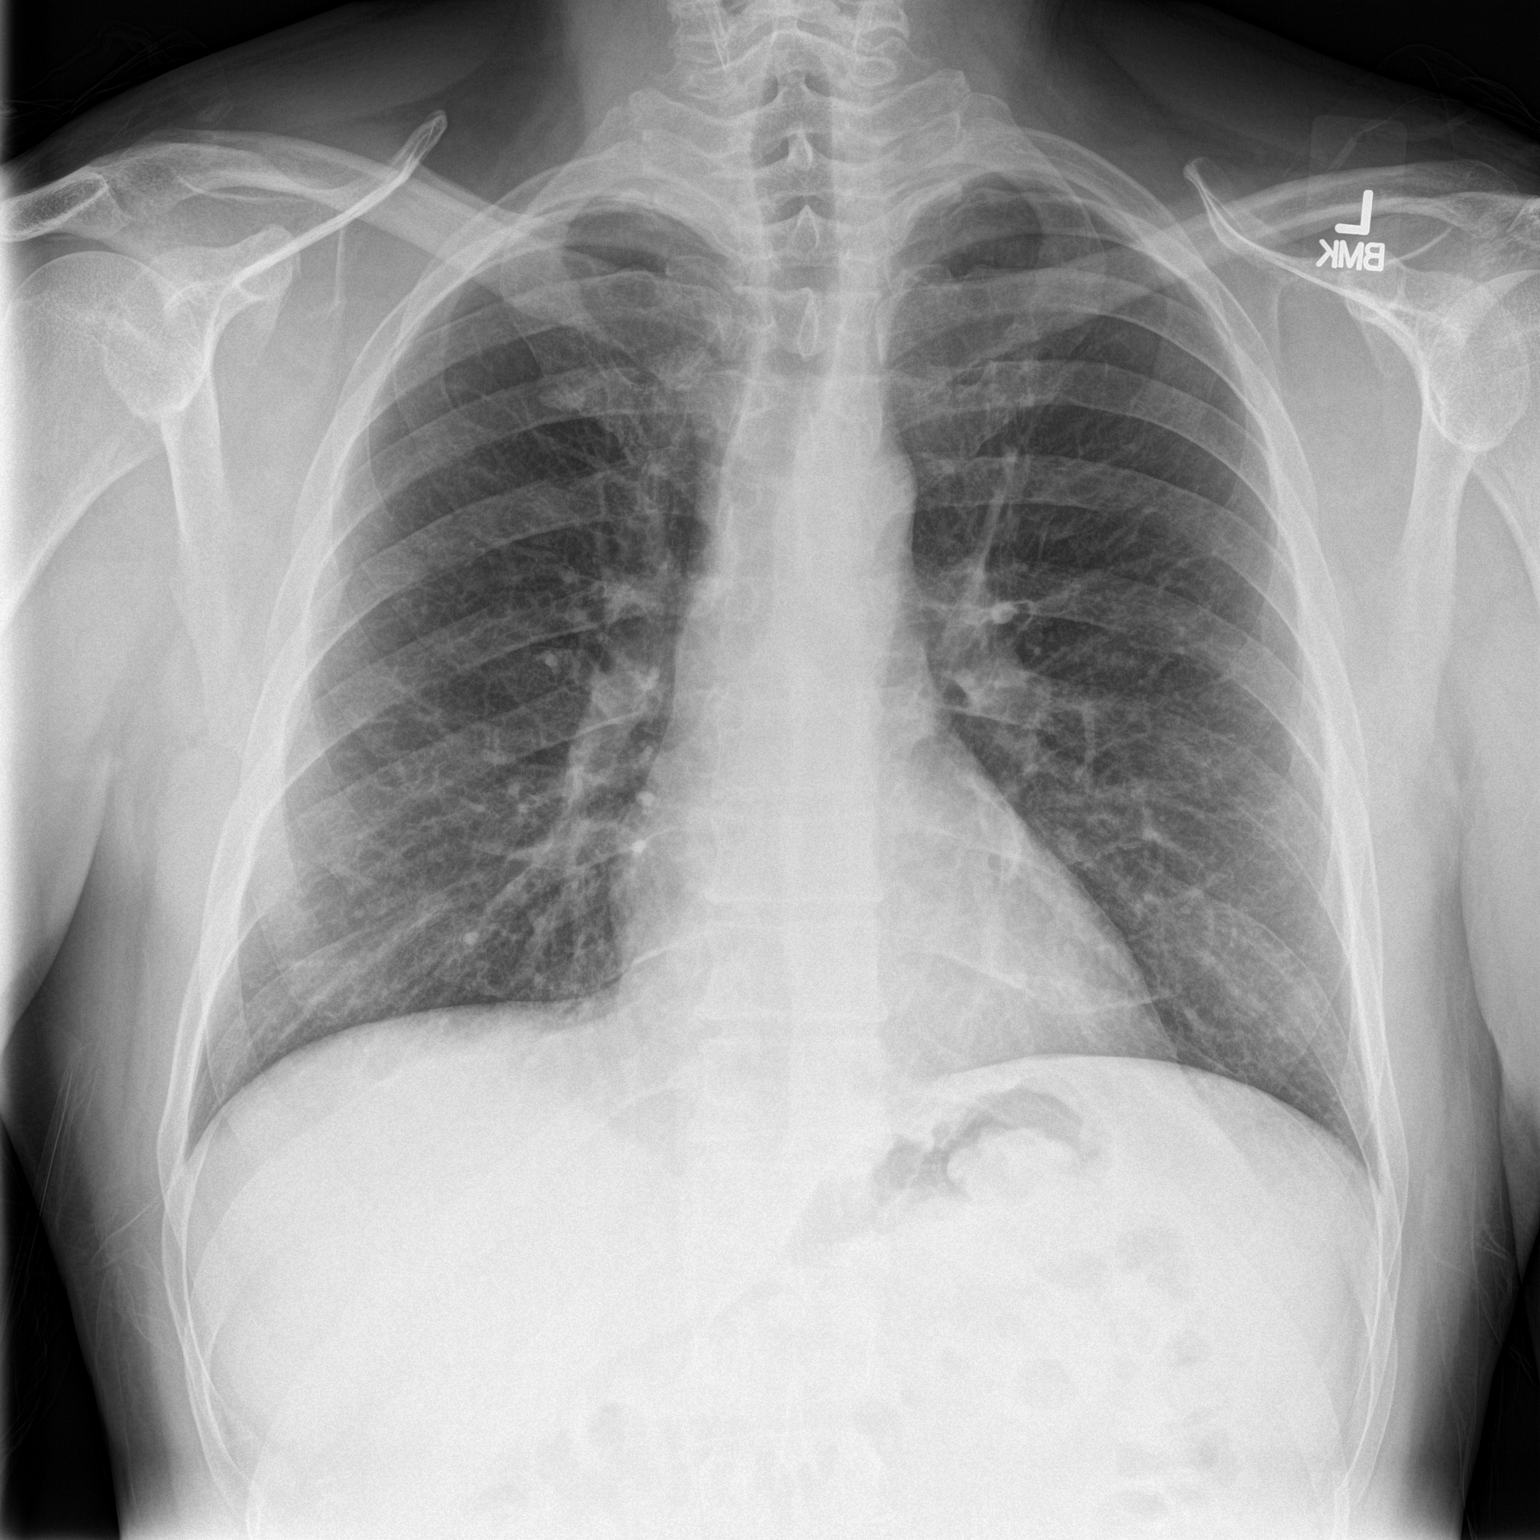
[im 2/2]
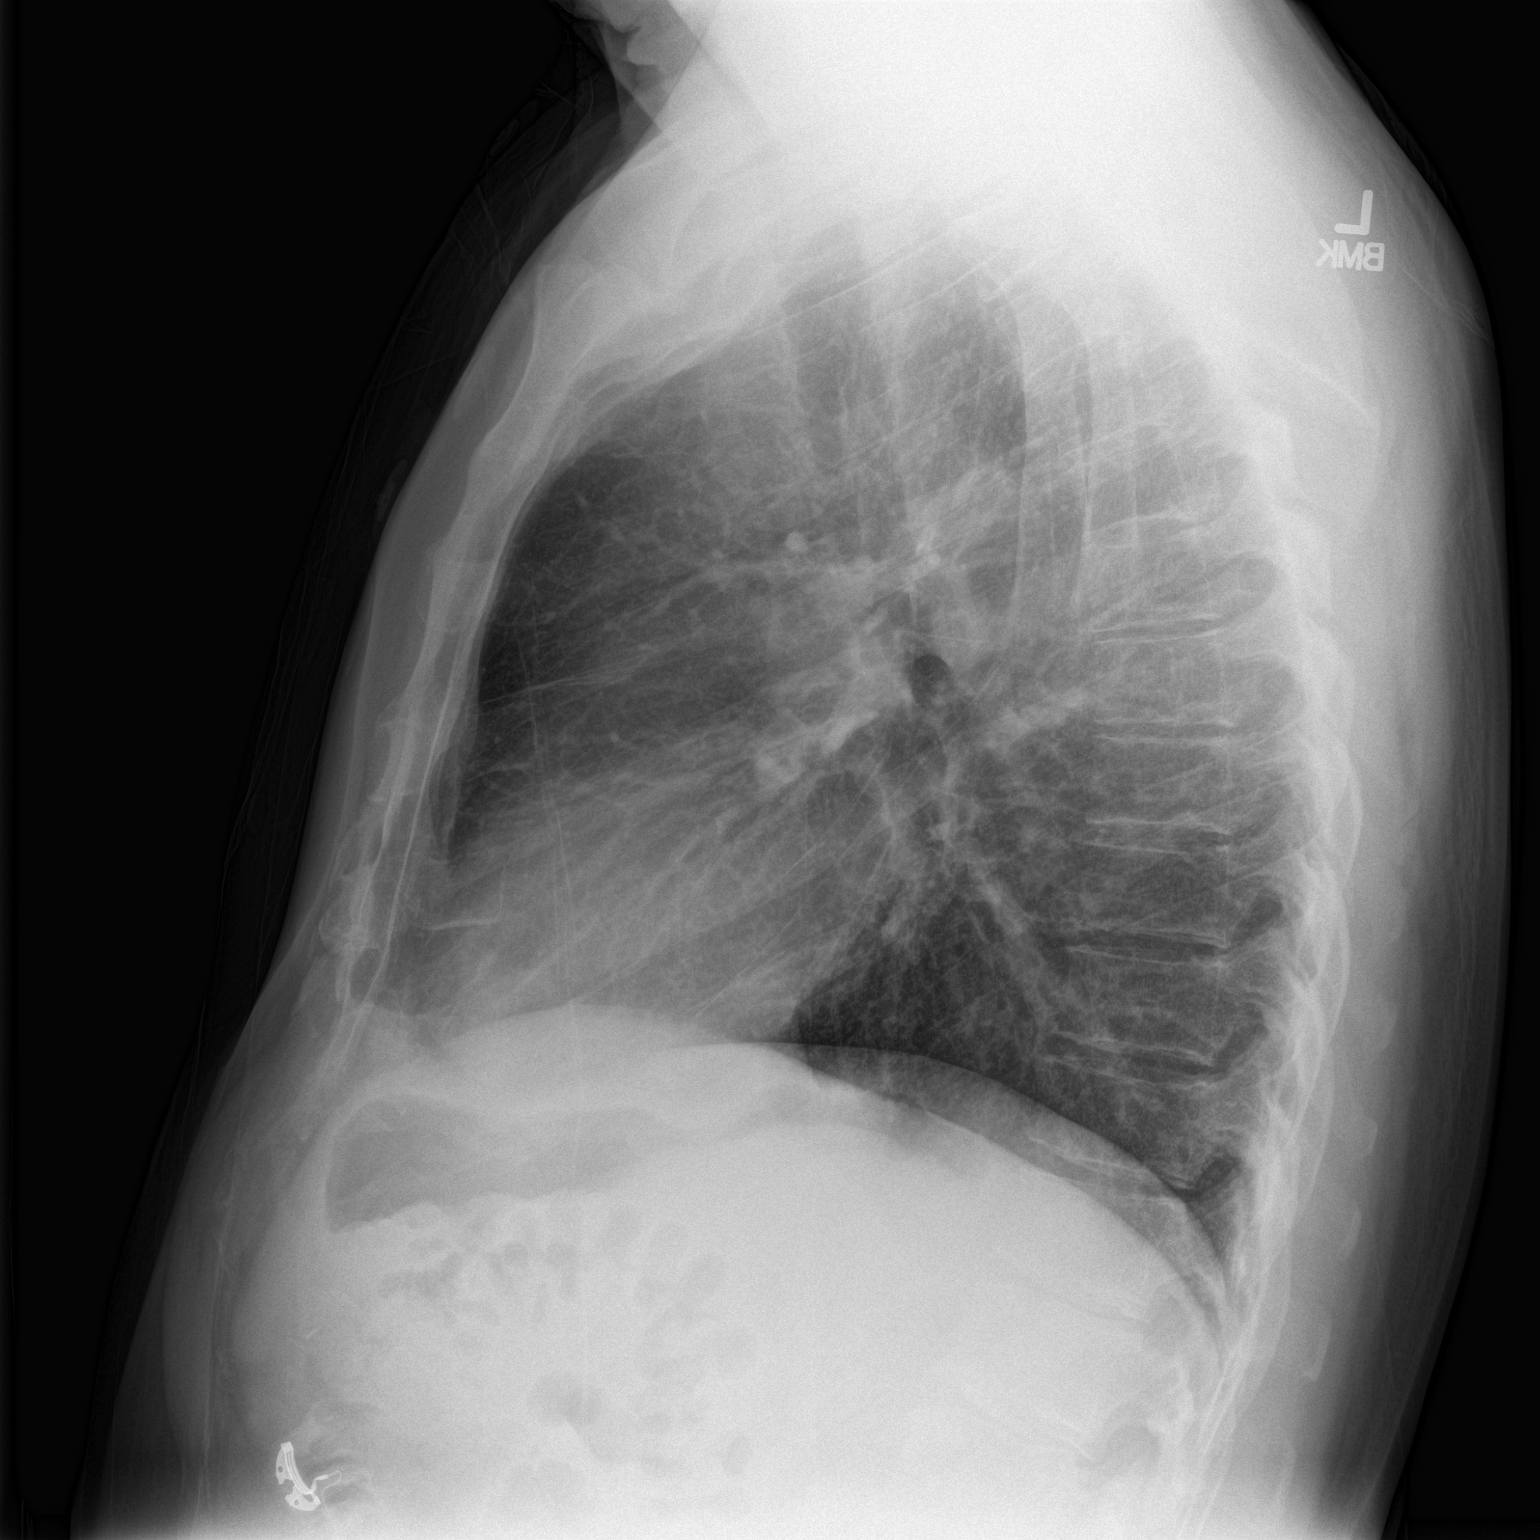

[2 of 2 positions shown; findings below may reference images not displayed]

FINDINGS: The small pulmonary nodules on prior CT are not seen
radiographically. The cardiomediastinal contours are normal.
Pulmonary vasculature is normal. No consolidation, pleural effusion,
or pneumothorax. No acute osseous abnormalities are seen.
IMPRESSION: No acute pulmonary process.

## 2017-03-31 ENCOUNTER — Ambulatory Visit: Admit: 2017-03-31 | Discharge: 2017-03-31 | Payer: PRIVATE HEALTH INSURANCE

## 2017-03-31 DIAGNOSIS — L570 Actinic keratosis: Secondary | ICD-10-CM

## 2017-03-31 NOTE — Unmapped (Signed)
This patient presents for spots arms, picks constantly, habit present months duration, arm location, symptoms include slight itch, presence  History of nonmelanoma skin cancer and actinic keratosis      On ROS feels well today, no recent fever or chills, no other lumps, bumps swelling.  Social History     Social History   ??? Marital status: Married     Spouse name: N/A   ??? Number of children: N/A   ??? Years of education: N/A     Occupational History   ??? Not on file.     Social History Main Topics   ??? Smoking status: Never Smoker   ??? Smokeless tobacco: Never Used   ??? Alcohol use No   ??? Drug use: No   ??? Sexual activity: Not on file     Other Topics Concern   ??? Not on file     Social History Narrative   ??? No narrative on file       Objective:  The patient is alert, awake and oriented x 3, well-nourished, good humored and in no apparent distress. I examined scalp and hair, eyelids and conjunctivae, face, lips and mouth, neck, chest, back abdomen and upper & lower extremities extremities including nails. 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  Benign nevi, no abcd  Assessment/Plan:   [place]  - We discussed the benign nature of these spots, our understanding of them, and the patient was reassured. However, should they change or worsen, return to clinic for reevaluation.  Liquid nitrogen was applied to the 2 actinic keratoses for 10-15 seconds. Post-operative care discussed with potential side effects.    We discussed OTC drug management with sunscreens, hats, clothing, how and why to use.  Discussed using bandages to stop from picking  FU 12 months

## 2017-04-07 DIAGNOSIS — J209 Acute bronchitis, unspecified: Secondary | ICD-10-CM | POA: Diagnosis not present

## 2017-04-07 DIAGNOSIS — H6691 Otitis media, unspecified, right ear: Secondary | ICD-10-CM | POA: Diagnosis not present

## 2017-04-07 DIAGNOSIS — J329 Chronic sinusitis, unspecified: Secondary | ICD-10-CM | POA: Diagnosis not present

## 2017-04-07 DIAGNOSIS — J069 Acute upper respiratory infection, unspecified: Secondary | ICD-10-CM | POA: Diagnosis not present

## 2017-04-20 DIAGNOSIS — J069 Acute upper respiratory infection, unspecified: Secondary | ICD-10-CM | POA: Diagnosis not present

## 2017-04-20 DIAGNOSIS — J209 Acute bronchitis, unspecified: Secondary | ICD-10-CM | POA: Diagnosis not present

## 2017-04-20 DIAGNOSIS — J329 Chronic sinusitis, unspecified: Secondary | ICD-10-CM | POA: Diagnosis not present

## 2017-04-28 DIAGNOSIS — Z23 Encounter for immunization: Secondary | ICD-10-CM | POA: Diagnosis not present

## 2017-04-28 DIAGNOSIS — J42 Unspecified chronic bronchitis: Secondary | ICD-10-CM | POA: Diagnosis not present

## 2017-04-28 DIAGNOSIS — I456 Pre-excitation syndrome: Secondary | ICD-10-CM | POA: Diagnosis not present

## 2017-04-28 DIAGNOSIS — Z1389 Encounter for screening for other disorder: Secondary | ICD-10-CM | POA: Diagnosis not present

## 2017-04-28 DIAGNOSIS — Z6824 Body mass index (BMI) 24.0-24.9, adult: Secondary | ICD-10-CM | POA: Diagnosis not present

## 2017-04-28 DIAGNOSIS — Z0001 Encounter for general adult medical examination with abnormal findings: Secondary | ICD-10-CM | POA: Diagnosis not present

## 2017-06-22 ENCOUNTER — Telehealth: Payer: Self-pay | Admitting: Internal Medicine

## 2017-06-22 DIAGNOSIS — R002 Palpitations: Secondary | ICD-10-CM

## 2017-06-22 MED ORDER — FLECAINIDE ACETATE 50 MG PO TABS: ORAL_TABLET | ORAL | 3 refills | Status: DC

## 2017-06-22 NOTE — Telephone Encounter (Signed)
New Message    Patient c/o Palpitations:  High priority if patient c/o lightheadedness, shortness of breath, or chest pain  1) How long have you had palpitations/irregular HR/ Afib? Are you having the symptoms now? Irregular heartbeats for more than a week   2) Are you currently experiencing lightheadedness, SOB or CP? SOB and lightheadness  3) Do you have a history of afib (atrial fibrillation) or irregular heart rhythm? A history of irregular heartbeats   4) Have you checked your BP or HR? (document readings if available): BP 150/90  5) Are you experiencing any other symptoms? None   Wife is not with Patient. Please call and advise

## 2017-06-22 NOTE — Telephone Encounter (Signed)
Returned call to Pt wife.  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  Pt is afraid to take an extra flecainide because he is afraid he will run out of flecainide before he can see Dr. Lovena Le in April.  Also wants to see Dr. Lovena Le before his scheduled appt in April.  Refilled Pt flecainide to the [facility] in [place] per wife.  Notified Pt to take an extra dose or two per Dr. Lovena Le last progress note.  Notified wife would try to get Pt in earlier to see Dr. Raiford Noble give to scheduling.  Wife indicates understanding.

## 2017-06-23 DIAGNOSIS — N401 Enlarged prostate with lower urinary tract symptoms: Secondary | ICD-10-CM | POA: Diagnosis not present

## 2017-06-23 DIAGNOSIS — R351 Nocturia: Secondary | ICD-10-CM | POA: Diagnosis not present

## 2017-06-25 ENCOUNTER — Ambulatory Visit: Payer: BLUE CROSS/BLUE SHIELD | Admitting: Internal Medicine

## 2017-06-25 ENCOUNTER — Encounter: Payer: Self-pay | Admitting: Internal Medicine

## 2017-06-25 ENCOUNTER — Encounter: Payer: Self-pay | Admitting: *Deleted

## 2017-06-25 VITALS — BP 128/76 | HR 56 | Ht 73.0 in | Wt 195.2 lb

## 2017-06-25 DIAGNOSIS — K219 Gastro-esophageal reflux disease without esophagitis: Secondary | ICD-10-CM | POA: Insufficient documentation

## 2017-06-25 DIAGNOSIS — R002 Palpitations: Secondary | ICD-10-CM

## 2017-06-25 DIAGNOSIS — I456 Pre-excitation syndrome: Secondary | ICD-10-CM

## 2017-06-25 MED ORDER — FLECAINIDE ACETATE 100 MG PO TABS: 100.0000 mg | ORAL_TABLET | Freq: Two times a day (BID) | ORAL | 3 refills | Status: DC

## 2017-06-25 NOTE — Patient Instructions (Addendum)
Medication Instructions:  Your physician has recommended you make the following change in your medication:  1.  Increase your flecainide:  Start taking 100 mg one tablet by mouth twice a day.  Labwork: None ordered.  Testing/Procedures: None ordered.  Follow-Up:  Please come in for an EKG nurse visit in 2 weeks when Dr. Lovena Le is in the office:   February 25 or 26, 2019. Please schedule:  9:00 am, 11:00 am or 2:00 pm.  Your physician wants you to follow-up in: 3-4 months with Dr. Lovena Le.      Any Other Special Instructions Will Be Listed Below (If Applicable).  If you need a refill on your cardiac medications before your next appointment, please call your pharmacy.

## 2017-06-25 NOTE — Progress Notes (Signed)
HPI Peter Flores returns today for followup of his WPW. He is a pleasant 62 yo man with known WPW but no tachycardia and prior resolution of ventricular pre-excitation who has had symptomatic PAC's and PVC's. He has taken flecainide for symptoms in the past. He has not had any documentation of sustained arrhythmias. Over the past few weeks, he has noted an increase in his palpitations. There is some uncertainty in how much of his flecainide he is taking. His wife thinks he has been non-compliant.  Allergies  Allergen Reactions  . Vicodin [Hydrocodone-Acetaminophen] Other (See Comments)    Shortness of breath     Current Outpatient Medications  Medication Sig Dispense Refill  . amLODipine (NORVASC) 5 MG tablet Take 1 tablet (5 mg total) by mouth daily. 90 tablet 3  . Cholecalciferol (VITAMIN D PO) Take 500 mg by mouth daily.     . flecainide (TAMBOCOR) 50 MG tablet Take 100mg  in the am and 50 mg in the pm 270 tablet 3  . Omega-3 Fatty Acids (FISH OIL PO) Take 1 tablet by mouth daily.    . Omeprazole (PRILOSEC PO) Take 20 mg by mouth daily.     . tadalafil (CIALIS) 10 MG tablet Take 1 tablet (10 mg total) by mouth once a week. As needed for erectile dysfunction 10 tablet 3   No current facility-administered medications for this visit.      Past Medical History:  Diagnosis Date  . Anxiety   . Arthritis   . Esophageal reflux 05/15/1999  . Family hx colonic polyps   . Hemorrhoids   . Hiatal hernia 05/15/1999  . IBS (irritable bowel syndrome)   . 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 Rectal fissure 04/30/1999  . Wolff-Parkinson-White (WPW) syndrome     ROS:   All systems reviewed and negative except as noted in the HPI.   Past Surgical History:  Procedure Laterality Date  . BACK SURGERY     x 2   . COLONOSCOPY    . KNEE SURGERY     Bilateral   . POLYPECTOMY       Family History  Problem Relation Age of Onset  . Colon polyps  Brother   . Coronary artery disease Brother 55       Stent  . Heart disease Brother   . Colon cancer Neg Hx      Social History   Socioeconomic History  . Marital status: Married    Spouse name: Not on file  . Number of children: 1  . Years of education: Not on file  . Highest education level: Not on file  Social Needs  . Financial resource strain: Not on file  . Food insecurity - worry: Not on file  . Food insecurity - inability: Not on file  . Transportation needs - medical: Not on file  . Transportation needs - non-medical: Not on file  Occupational History  . Occupation: Sports administrator: [employer]  Tobacco Use  . Smoking status: Former Smoker    Last attempt to quit: 08/05/1981    Years since quitting: 35.9  . Smokeless tobacco: Never Used  Substance and Sexual Activity  . Alcohol use: No  . Drug use: No  . Sexual activity: Not on file  Other Topics Concern  . Not on file  Social History Narrative   2 caffeine drinks daily. (Currently off.)     BP 128/76  Pulse (!) 56   Ht 6\' 1"  (1.854 m)   Wt 195 lb 3.2 oz (88.5 kg)   BMI 25.75 kg/m   Physical Exam:  Well appearing 62 yo man, NAD HEENT: Unremarkable Neck:  6 cm JVD, no thyromegally Lymphatics:  No adenopathy Back:  No CVA tenderness Lungs:  Clear with no wheezes HEART:  Regular rate rhythm, no murmurs, no rubs, no clicks Abd:  soft, positive bowel sounds, no organomegally, no rebound, no guarding Ext:  2 plus pulses, no edema, no cyanosis, no clubbing Skin:  No rashes no nodules Neuro:  CN II through XII intact, motor grossly intact  EKG - NSR with a rightward axis  DEVICE  Normal device function.  See PaceArt for details.   Assess/Plan: 1. Palpitations - his symptoms have worsened. I have asked him to take flecainide 100 bid and to come back in 2 weeks for an ECG.  2. WPW - he has no evidence of ventricular pre-excitation. No change in meds. 3. PAC's and PVC's - if his symptoms  continue, I will ask him to wear a 48 hour holter so we can obtain a rhythm diagnosis to go with his symptoms of palpitations.   Cristopher Peru, M.D.

## 2017-06-30 DIAGNOSIS — Z1389 Encounter for screening for other disorder: Secondary | ICD-10-CM | POA: Diagnosis not present

## 2017-06-30 DIAGNOSIS — Z6825 Body mass index (BMI) 25.0-25.9, adult: Secondary | ICD-10-CM | POA: Diagnosis not present

## 2017-06-30 DIAGNOSIS — H6981 Other specified disorders of Eustachian tube, right ear: Secondary | ICD-10-CM | POA: Diagnosis not present

## 2017-06-30 DIAGNOSIS — J019 Acute sinusitis, unspecified: Secondary | ICD-10-CM | POA: Diagnosis not present

## 2017-06-30 DIAGNOSIS — E663 Overweight: Secondary | ICD-10-CM | POA: Diagnosis not present

## 2017-07-11 ENCOUNTER — Other Ambulatory Visit: Payer: Self-pay | Admitting: Internal Medicine

## 2017-07-11 DIAGNOSIS — I10 Essential (primary) hypertension: Secondary | ICD-10-CM

## 2017-07-14 ENCOUNTER — Ambulatory Visit: Payer: BLUE CROSS/BLUE SHIELD

## 2017-07-17 ENCOUNTER — Ambulatory Visit (INDEPENDENT_AMBULATORY_CARE_PROVIDER_SITE_OTHER): Payer: BLUE CROSS/BLUE SHIELD | Admitting: *Deleted

## 2017-07-17 VITALS — Wt 192.0 lb

## 2017-07-17 DIAGNOSIS — R002 Palpitations: Secondary | ICD-10-CM | POA: Diagnosis not present

## 2017-07-17 DIAGNOSIS — I456 Pre-excitation syndrome: Secondary | ICD-10-CM

## 2017-07-17 NOTE — Progress Notes (Signed)
Pt here for EKG. He reports heart is still skipping. Feels it beating hard at times. Worse during the day. OK when he settles down at night. EKG reviewed by Dr. Lovena Le. Pt to continue same medications and wear 48 hour holter monitor.

## 2017-07-17 NOTE — Patient Instructions (Addendum)
Medication Instructions:  Your physician recommends that you continue on your current medications as directed. Please refer to the Current Medication list given to you today.   Labwork: none  Testing/Procedures: Your physician has recommended that you wear a holter monitor. Holter monitors are medical devices that record the heart's electrical activity. Doctors most often use these monitors to diagnose arrhythmias. Arrhythmias are problems with the speed or rhythm of the heartbeat. The monitor is a small, portable device. You can wear one while you do your normal daily activities. This is usually used to diagnose what is causing palpitations/syncope (passing out).    Follow-Up: Your physician recommends that you schedule a follow-up appointment with Dr Lovena Le as planned      Any Other Special Instructions Will Be Listed Below (If Applicable).     If you need a refill on your cardiac medications before your next appointment, please call your pharmacy.

## 2017-07-28 ENCOUNTER — Ambulatory Visit (INDEPENDENT_AMBULATORY_CARE_PROVIDER_SITE_OTHER): Payer: BLUE CROSS/BLUE SHIELD

## 2017-07-28 DIAGNOSIS — R002 Palpitations: Secondary | ICD-10-CM | POA: Diagnosis not present

## 2017-07-28 DIAGNOSIS — I456 Pre-excitation syndrome: Secondary | ICD-10-CM

## 2017-08-21 ENCOUNTER — Ambulatory Visit: Payer: BLUE CROSS/BLUE SHIELD | Admitting: Internal Medicine

## 2017-10-08 ENCOUNTER — Encounter: Payer: Self-pay | Admitting: Internal Medicine

## 2017-10-08 ENCOUNTER — Ambulatory Visit: Payer: BLUE CROSS/BLUE SHIELD | Admitting: Internal Medicine

## 2017-10-08 VITALS — BP 148/88 | HR 54 | Ht 73.5 in | Wt 193.2 lb

## 2017-10-08 DIAGNOSIS — I456 Pre-excitation syndrome: Secondary | ICD-10-CM

## 2017-10-08 DIAGNOSIS — R002 Palpitations: Secondary | ICD-10-CM

## 2017-10-08 MED ORDER — PROPAFENONE HCL 225 MG PO TABS: 225.0000 mg | ORAL_TABLET | Freq: Two times a day (BID) | ORAL | 11 refills | Status: DC

## 2017-10-08 NOTE — Progress Notes (Signed)
HPI [name] returns today for followup of his WPW. He is a pleasant 62 yo man with known WPW but no tachycardia and prior resolution of ventricular pre-excitation who has had symptomatic PAC's and PVC's. He has taken flecainide for symptoms in the past. He has not had any documentation of sustained arrhythmias. Over the past few weeks, he has noted an increase in his palpitations. Previously there was a question of non-compliance but he denies this currently. The patient is very cardiac aware. He wore a cardiac monitor which demonstrated PVC's, PAC's and NS atrial tachy.  Allergies  Allergen Reactions  . Vicodin [Hydrocodone-Acetaminophen] Other (See Comments)    Shortness of breath     Current Outpatient Medications  Medication Sig Dispense Refill  . amLODipine (NORVASC) 5 MG tablet TAKE 1 TABLET BY MOUTH ONCE DAILY 90 tablet 3  . Cholecalciferol (VITAMIN D PO) Take 500 mg by mouth daily.     . flecainide (TAMBOCOR) 100 MG tablet Take 1 tablet (100 mg total) by mouth 2 (two) times daily. 180 tablet 3  . Omega-3 Fatty Acids (FISH OIL PO) Take 1 tablet by mouth daily.    . Omeprazole (PRILOSEC PO) Take 20 mg by mouth daily.      No current facility-administered medications for this visit.      Past Medical History:  Diagnosis Date  . Anxiety   . Arthritis   . Esophageal reflux 05/15/1999  . Family hx colonic polyps   . Hemorrhoids   . Hiatal hernia 05/15/1999  . IBS (irritable bowel syndrome)   . 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 Rectal fissure 04/30/1999  . Wolff-Parkinson-White (WPW) syndrome     ROS:   All systems reviewed and negative except as noted in the HPI.   Past Surgical History:  Procedure Laterality Date  . BACK SURGERY     x 2   . COLONOSCOPY    . KNEE SURGERY     Bilateral   . POLYPECTOMY       Family History  Problem Relation Age of Onset  . Colon polyps Brother   . Coronary artery disease Brother  23       Stent  . Heart disease Brother   . Colon cancer Neg Hx      Social History   Socioeconomic History  . Marital status: Married    Spouse name: Not on file  . Number of children: 1  . Years of education: Not on file  . Highest education level: Not on file  Occupational History  . Occupation: Sports administrator: [employer]  . Financial resource strain: Not on file  . Food insecurity:    Worry: Not on file    Inability: Not on file  . Transportation needs:    Medical: Not on file    Non-medical: Not on file  Tobacco Use  . Smoking status: Former Smoker    Last attempt to quit: 08/05/1981    Years since quitting: 36.2  . Smokeless tobacco: Never Used  Substance and Sexual Activity  . Alcohol use: No  . Drug use: No  . Sexual activity: Not on file  Lifestyle  . Physical activity:    Days per week: Not on file    Minutes per session: Not on file  . Stress: Not on file  Relationships  . Social connections:    Talks on phone: Not on  file    Gets together: Not on file    Attends religious service: Not on file    Active member of club or organization: Not on file    Attends meetings of clubs or organizations: Not on file    Relationship status: Not on file  . Intimate partner violence:    Fear of current or ex partner: Not on file    Emotionally abused: Not on file    Physically abused: Not on file    Forced sexual activity: Not on file  Other Topics Concern  . Not on file  Social History Narrative   2 caffeine drinks daily. (Currently off.)     BP (!) 148/88   Pulse (!) 54   Ht 6' 1.5" (1.867 m)   Wt 193 lb 3.2 oz (87.6 kg)   SpO2 100%   BMI 25.14 kg/m   Physical Exam:  Well appearing 62 yo man, NAD HEENT: Unremarkable Neck:  6 cm JVD, no thyromegally Lymphatics:  No adenopathy Back:  No CVA tenderness Lungs:  Clear with no wheezes HEART:  Regular rate rhythm, no murmurs, no rubs, no clicks Abd:  soft, positive bowel  sounds, no organomegally, no rebound, no guarding Ext:  2 plus pulses, no edema, no cyanosis, no clubbing Skin:  No rashes no nodules Neuro:  CN II through XII intact, motor grossly intact  EKG - nsr with no pre-excitation, QRS 112  Assess/Plan:  1. Palpitations - the etiology of his symptoms are unclear. He could have PAF although this was not seen when he wore the cardiac monitor. As the flecainide has become ineffective, I considered trying amio but will try propafenone first.  2. WPW - his ECG today does not show ventricular pre-excitation. No SVT 3. PVC's - he had less than 1% ventricular ectopy when he wore his cardiac monitor.  Mikle Bosworth.D.

## 2017-10-08 NOTE — Patient Instructions (Addendum)
Medication Instructions:  Your physician has recommended you make the following change in your medication:  1.  Stop taking flecainide now 2.  Start taking propafenone 225 mg one capsule by mouth twice a day.  START TAKING TOMORROW NIGHT.  Labwork: None ordered.  Testing/Procedures: You will come back to our office in 2 weeks for a 12 lead EKG. Please schedule.  Follow-Up: We will make further recommendation based on your EKG.   Any Other Special Instructions Will Be Listed Below (If Applicable).  If you need a refill on your cardiac medications before your next appointment, please call your pharmacy.   Propafenone tablets What is this medicine? PROPAFENONE (proe pa FEEN one) is an antiarrhythmic agent. It is used to treat irregular heart rhythm and can slow rapid heartbeats. This medicine can help your heart to return to and maintain a normal rhythm. This medicine may be used for other purposes; ask your health care provider or pharmacist if you have questions. COMMON BRAND NAME(S): Rythmol What should I tell my health care provider before I take this medicine? They need to know if you have any of these conditions: -heart disease -high blood levels of potassium -kidney disease -liver disease -low blood pressure -lung disease like asthma, chronic bronchitis or emphysema -pacemaker -slow heart rate -an unusual or allergic reaction to propafenone, other medicines, foods, dyes, or preservatives -pregnant or trying to get pregnant -breast-feeding How should I use this medicine? Take this medicine by mouth with a glass of water. Follow the directions on the prescription label. You can take this medicine with or without food. Take your doses at regular intervals. Do not take your medicine more often than directed. Do not stop taking except on the advice of your doctor or health care professional. Talk to your pediatrician regarding the use of this medicine in children. Special care  may be needed. Overdosage: If you think you have taken too much of this medicine contact a poison control center or emergency room at once. NOTE: This medicine is only for you. Do not share this medicine with others. What if I miss a dose? If you miss a dose, take it as soon as you can. If it is almost time for your next dose, take only that dose. Do not take double or extra doses. What may interact with this medicine? Do not take this medicine with any of the following medications: -arsenic trioxide -certain antibiotics like clarithromycin, erythromycin, grepafloxacin, pentamidine, sparfloxacin, troleandomycin -certain medicines for depression or mental illness like amoxapine, haloperidol, maprotiline, pimozide, sertindole, thioridazine, tricyclic antidepressants, ziprasidone -certain medicines for fungal infections like fluconazole, itraconazole, ketoconazole, posaconazole, voriconazole -certain medicines for irregular heart beat like dofetilide, dronedarone -certain medicines for malaria like chloroquine, halofantrine -cisapride -droperidol -levomethadyl -ranolazine -ritonavir This medicine may also interact with the following medications: -certain medicines for angina or blood pressure -certain medicines for asthma or breathing difficulties like formoterol, salmeterol -certain medicines that treat or prevent blood clots like warfarin -cimetidine -cyclosporine -digoxin -diuretics -local anesthetics -other medicines that prolong the QT interval (cause an abnormal heart rhythm) -rifampin -theophylline This list may not describe all possible interactions. Give your health care provider a list of all the medicines, herbs, non-prescription drugs, or dietary supplements you use. Also tell them if you smoke, drink alcohol, or use illegal drugs. Some items may interact with your medicine. What should I watch for while using this medicine? Your condition will be monitored closely when you  first begin therapy. Often, this drug is  first started in a hospital or other monitored health care setting. Once you are on maintenance therapy, visit your doctor or health care professional for regular checks on your progress. Because your condition and use of this medicine carry some risk, it is a good idea to carry an identification card, necklace or bracelet with details of your condition, medications, and doctor or health care professional. Dennis Bast may get drowsy or dizzy. Do not drive, use machinery, or do anything that needs mental alertness until you know how this medicine affects you. Do not stand or sit up quickly, especially if you are an older patient. This reduces the risk of dizzy or fainting spells. If you are going to have surgery, tell your doctor or health care professional that you are taking this medicine. What side effects may I notice from receiving this medicine? Side effects that you should report to your doctor or health care professional as soon as possible: -chest pain, palpitations -fever or chills -shortness of breath -swelling of feet or legs -trembling or shaking Side effects that usually do not require medical attention (report to your doctor or health care professional if they continue or are bothersome): -blurred vision -changes in taste (a metallic or bitter taste) -constipation or diarrhea -dry mouth -headache -nausea or vomiting -tiredness or weakness This list may not describe all possible side effects. Call your doctor for medical advice about side effects. You may report side effects to FDA at 1-800-FDA-1088. Where should I keep my medicine? Keep out of the reach of children. Store at room temperature between 15 and 30 degrees C (59 and 86 degrees F). Protect from light. Keep container tightly closed. Throw away any unused medicine after the expiration date. NOTE: This sheet is a summary. It may not cover all possible information. If you have questions about  this medicine, talk to your doctor, pharmacist, or health care provider.  2018 Elsevier/Gold Standard (2012-11-29 13:27:06)

## 2017-10-15 ENCOUNTER — Telehealth: Payer: Self-pay | Admitting: Internal Medicine

## 2017-10-15 NOTE — Telephone Encounter (Signed)
New message   Pt c/o BP issue: STAT if pt c/o blurred vision, one-sided weakness or slurred speech  1. What are your last 5 BP readings? 142/83 at 7:45, 201/104 at 3am  2. Are you having any other symptoms (ex. Dizziness, headache, blurred vision, passed out)? Headache  3. What is your BP issue? Patient states BP spikes at times No chest pain No SOB

## 2017-10-16 ENCOUNTER — Encounter: Payer: Self-pay | Admitting: Emergency Medicine

## 2017-10-16 ENCOUNTER — Other Ambulatory Visit: Payer: Self-pay

## 2017-10-16 ENCOUNTER — Emergency Department
Admission: EM | Admit: 2017-10-16 | Discharge: 2017-10-16 | Disposition: A | Discharge status: Home / Self Care | Payer: BLUE CROSS/BLUE SHIELD | Attending: Emergency Medicine | Admitting: Emergency Medicine

## 2017-10-16 DIAGNOSIS — R110 Nausea: Secondary | ICD-10-CM | POA: Diagnosis not present

## 2017-10-16 DIAGNOSIS — Z87891 Personal history of nicotine dependence: Secondary | ICD-10-CM | POA: Insufficient documentation

## 2017-10-16 DIAGNOSIS — Z79899 Other long term (current) drug therapy: Secondary | ICD-10-CM | POA: Insufficient documentation

## 2017-10-16 DIAGNOSIS — I10 Essential (primary) hypertension: Secondary | ICD-10-CM | POA: Diagnosis not present

## 2017-10-16 DIAGNOSIS — R42 Dizziness and giddiness: Secondary | ICD-10-CM | POA: Diagnosis not present

## 2017-10-16 LAB — CBC
HEMATOCRIT: 39 % — AB (ref 40.0–52.0)
HEMOGLOBIN: 13.7 g/dL (ref 13.0–18.0)
MCH: 31.6 pg (ref 26.0–34.0)
MCHC: 35.3 g/dL (ref 32.0–36.0)
MCV: 89.7 fL (ref 80.0–100.0)
Platelets: 185 10*3/uL (ref 150–440)
RBC: 4.34 MIL/uL — AB (ref 4.40–5.90)
RDW: 12.5 % (ref 11.5–14.5)
WBC: 4.5 10*3/uL (ref 3.8–10.6)

## 2017-10-16 LAB — BASIC METABOLIC PANEL
ANION GAP: 8 (ref 5–15)
BUN: 16 mg/dL (ref 6–20)
CHLORIDE: 103 mmol/L (ref 101–111)
CO2: 25 mmol/L (ref 22–32)
Calcium: 9.1 mg/dL (ref 8.9–10.3)
Creatinine, Ser: 1.02 mg/dL (ref 0.61–1.24)
GFR calc Af Amer: >60 mL/min (ref >60)
GLUCOSE: 127 mg/dL — AB (ref 65–99)
POTASSIUM: 3.8 mmol/L (ref 3.5–5.1)
Sodium: 136 mmol/L (ref 135–145)

## 2017-10-16 LAB — TROPONIN I: Troponin I: <0.03 ng/mL (ref <0.03)

## 2017-10-16 NOTE — ED Notes (Signed)
Pt ambulatory without difficulty, dc instructions discussed and follow up care discussed.  Pt verbalized understanding.  Pt denies any dizziness with ambulatory.  Pt states he is going to follow up with cardiologist and has already placed a call to the office.

## 2017-10-16 NOTE — Telephone Encounter (Signed)
Attempted to call Pt this morning-per review Pt in ER. Pt discharged shortly after-Call placed to Pt and advised had changed his nurse visit on June 6 to office visit with Dr. Lovena Le. Pt with questions regarding labile BP.   Will have Pt discuss with Dr. Lovena Le on Thursday.No changes made in ER.

## 2017-10-16 NOTE — ED Triage Notes (Addendum)
Patient ambulatory to triage with steady gait, without difficulty or distress noted; pt reports recent med changes; c/o dizziness this morning accomp by nausea; pt reports hx afib; pt taken to room 19 by EDT Mayra to be placed on card monitor for EKG; report called to care nurse Ophelia Charter, RN

## 2017-10-16 NOTE — ED Notes (Signed)
Pt states that he woke up yesterday morning and didn't really feel right. Then today he felt like he was going to black out so he came to ER. He states that he knew his BP has been high lately. He also states that his left eye feels blurry.  Pt is alert and oriented x 4.

## 2017-10-16 NOTE — ED Provider Notes (Signed)
[facility] Emergency Department Provider Note  Time seen: 7:10 AM  I have reviewed the triage vital signs and the nursing notes.   HISTORY  Chief Complaint Hypertension and Dizziness    HPI Peter Flores is a 62 y.o. male with a past medical history of anxiety, hypertension, Wolff-Parkinson-White, presents to the emergency department for high blood pressure and dizziness.  According to the patient this morning while driving to work he began feeling very dizzy and nauseated like he might pass out.  Patient states he typically gets these symptoms when his blood pressure goes up.  States he has been having problems with his blood pressure recently and is following up with his doctor.  Was going to just call his doctor but felt it was best to go ahead and get checked out.  Denies any chest pain or trouble breathing.  Currently he states he feels much better.  Blood pressure currently 148/99.  Patient denies any chest pain, shortness of breath or nausea.  Largely negative review of systems.   Past Medical History:  Diagnosis Date  . Anxiety   . Arthritis   . Esophageal reflux 05/15/1999  . Family hx colonic polyps   . Hemorrhoids   . Hiatal hernia 05/15/1999  . IBS (irritable bowel syndrome)   . 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 Rectal fissure 04/30/1999  . Wolff-Parkinson-White (WPW) syndrome     Patient Active Problem List   Diagnosis Date Noted  . GERD (gastroesophageal reflux disease) 06/25/2017  . Heat cramps 12/02/2013  . WPW (Wolff-Parkinson-White syndrome) 08/06/2011  . HTN (hypertension) 08/06/2011  . Palpitation 08/06/2011  . Dyspnea 08/06/2011    Past Surgical History:  Procedure Laterality Date  . BACK SURGERY     x 2   . COLONOSCOPY    . KNEE SURGERY     Bilateral   . POLYPECTOMY      Prior to Admission medications   Medication Sig Start Date End Date Taking? Authorizing Provider  amLODipine  (NORVASC) 5 MG tablet TAKE 1 TABLET BY MOUTH ONCE DAILY 07/13/17   Evans Lance, MD  Cholecalciferol (VITAMIN D PO) Take 500 mg by mouth daily.     [provider]  Omega-3 Fatty Acids (FISH OIL PO) Take 1 tablet by mouth daily.    [provider]  Omeprazole (PRILOSEC PO) Take 20 mg by mouth daily.     [provider]  propafenone (RYTHMOL) 225 MG tablet Take 1 tablet (225 mg total) by mouth 2 (two) times daily. 10/08/17   Evans Lance, MD    Allergies  Allergen Reactions  . Vicodin [Hydrocodone-Acetaminophen] Other (See Comments)    Shortness of breath    Family History  Problem Relation Age of Onset  . Colon polyps Brother   . Coronary artery disease Brother 55       Stent  . Heart disease Brother   . Colon cancer Neg Hx     Social History Social History   Tobacco Use  . Smoking status: Former Smoker    Last attempt to quit: 08/05/1981    Years since quitting: 36.2  . Smokeless tobacco: Never Used  Substance Use Topics  . Alcohol use: No  . Drug use: No    Review of Systems Constitutional: Negative for fever. Eyes: Negative for visual complaints ENT: Negative for recent illness/congestion Cardiovascular: Negative for chest pain. Respiratory: Negative for shortness of breath. Gastrointestinal: Negative for abdominal  pain.  Positive for nausea this morning, now resolved. Genitourinary: Negative for urinary compaints Musculoskeletal: Negative for leg pain or swelling Skin: Negative for skin complaints  Neurological: Negative for headache All other ROS negative  ____________________________________________   PHYSICAL EXAM:  VITAL SIGNS: ED Triage Vitals  Enc Vitals Group     BP 10/16/17 0615 (!) 191/88     Pulse Rate 10/16/17 0615 65     Resp 10/16/17 0622 15     Temp 10/16/17 0615 97.9 F (36.6 C)     Temp Source 10/16/17 0615 Oral     SpO2 10/16/17 0615 100 %     Weight 10/16/17 0614 195 lb (88.5 kg)     Height 10/16/17  0614 6\' 1"  (1.854 m)     Head Circumference --      Peak Flow --      Pain Score 10/16/17 0614 0     Pain Loc --      Pain Edu? --      Excl. in [place]? --    Constitutional: Alert and oriented. Well appearing and in no distress. Eyes: Normal exam ENT   Head: Normocephalic and atraumatic.   Mouth/Throat: Mucous membranes are moist. Cardiovascular: Normal rate, regular rhythm. No murmur Respiratory: Normal respiratory effort without tachypnea nor retractions. Breath sounds are clear Gastrointestinal: Soft and nontender. No distention. Musculoskeletal: Nontender with normal range of motion in all extremities. Neurologic:  Normal speech and language. No gross focal neurologic deficits  Skin:  Skin is warm, dry and intact.  Psychiatric: Mood and affect are normal.   ____________________________________________    EKG  EKG reviewed and interpreted by myself shows normal sinus rhythm at 64 bpm with a narrow QRS, normal axis, normal intervals, nonspecific ST changes without ST elevation.  ____________________________________________   INITIAL IMPRESSION / ASSESSMENT AND PLAN / ED COURSE  Pertinent labs & imaging results that were available during my care of the patient were reviewed by me and considered in my medical decision making (see chart for details).  Patient presents to the emergency department for dizziness and high blood pressure.  Differential would include hypertension, anxiety, palpitations, nausea, ACS, electrolyte abnormality.  Overall the patient appears very well, no distress.  He does admit that he got very anxious when he felt like his blood pressure was elevated.  He states now he feels much better.  Denies any chest pain at any point.  We will check labs including cardiac enzymes.  EKG shows no concerning findings at this time.  Overall the patient appears well with a blood pressure of 148/99 currently.  Patient's labs are normal.  Patient continues to appear  well.  We will discharge patient with PCP follow-up.  Patient agreeable with this plan of care.  ____________________________________________   FINAL CLINICAL IMPRESSION(S) / ED DIAGNOSES  Hypertension    Harvest Dark, MD 10/16/17 [phone]

## 2017-10-22 ENCOUNTER — Ambulatory Visit: Payer: BLUE CROSS/BLUE SHIELD | Admitting: Internal Medicine

## 2017-10-22 ENCOUNTER — Encounter: Payer: Self-pay | Admitting: Internal Medicine

## 2017-10-22 ENCOUNTER — Ambulatory Visit: Payer: BLUE CROSS/BLUE SHIELD

## 2017-10-22 VITALS — BP 142/86 | HR 61 | Ht 73.0 in | Wt 197.0 lb

## 2017-10-22 DIAGNOSIS — R002 Palpitations: Secondary | ICD-10-CM

## 2017-10-22 DIAGNOSIS — I456 Pre-excitation syndrome: Secondary | ICD-10-CM

## 2017-10-22 MED ORDER — AMLODIPINE BESYLATE 10 MG PO TABS: 10.0000 mg | ORAL_TABLET | Freq: Every day | ORAL | 3 refills | Status: DC

## 2017-10-22 NOTE — Patient Instructions (Addendum)
Medication Instructions:  Your physician has recommended you make the following change in your medication: 1.  Increase your amlodipine 5 mg.  Take 2 tablets daily until your current prescription runs out.  Then your refill is for Amlodipine 10 mg one tablet daily.  Labwork: None ordered.  Testing/Procedures: None ordered.  Follow-Up: Your physician wants you to follow-up in: 3 months with Dr. Lovena Le.    Any Other Special Instructions Will Be Listed Below (If Applicable).  Check your heart rate and blood pressure weekly and record in a log.  Bring the log back with you to your follow up appointment.  If you need a refill on your cardiac medications before your next appointment, please call your pharmacy.

## 2017-10-22 NOTE — Progress Notes (Signed)
HPI [name] returns today for followup of his WPW. He is a pleasant 62 yo man with known WPW but no tachycardia and prior resolution of ventricular pre-excitation who has had symptomatic PAC's and PVC's. He has taken flecainide for symptoms in the past. He has not had any documentation of sustained arrhythmias.Over the past few weeks, he has noted an increase in his palpitations. Previously there was a question of non-compliance but he denies this currently. The patient is very cardiac aware. He wore a cardiac monitor which demonstrated PVC's, PAC's and NS atrial tachy. He has had problems with uncontrolled HTN. Palpitations are still present but improved.  Allergies  Allergen Reactions  . Vicodin [Hydrocodone-Acetaminophen] Other (See Comments)    Shortness of breath     Current Outpatient Medications  Medication Sig Dispense Refill  . amLODipine (NORVASC) 5 MG tablet TAKE 1 TABLET BY MOUTH ONCE DAILY 90 tablet 3  . Cholecalciferol (VITAMIN D PO) Take 500 mg by mouth daily.     . Omega-3 Fatty Acids (FISH OIL PO) Take 1 tablet by mouth daily.    . Omeprazole (PRILOSEC PO) Take 20 mg by mouth daily.     . propafenone (RYTHMOL) 225 MG tablet Take 1 tablet (225 mg total) by mouth 2 (two) times daily. 60 tablet 11   No current facility-administered medications for this visit.      Past Medical History:  Diagnosis Date  . Anxiety   . Arthritis   . Esophageal reflux 05/15/1999  . Family hx colonic polyps   . Hemorrhoids   . Hiatal hernia 05/15/1999  . IBS (irritable bowel syndrome)   . 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 Rectal fissure 04/30/1999  . Wolff-Parkinson-White (WPW) syndrome     ROS:   All systems reviewed and negative except as noted in the HPI.   Past Surgical History:  Procedure Laterality Date  . BACK SURGERY     x 2   . COLONOSCOPY    . KNEE SURGERY     Bilateral   . POLYPECTOMY       Family History    Problem Relation Age of Onset  . Colon polyps Brother   . Coronary artery disease Brother 74       Stent  . Heart disease Brother   . Colon cancer Neg Hx      Social History   Socioeconomic History  . Marital status: Married    Spouse name: Not on file  . Number of children: 1  . Years of education: Not on file  . Highest education level: Not on file  Occupational History  . Occupation: Sports administrator: [employer]  . Financial resource strain: Not on file  . Food insecurity:    Worry: Not on file    Inability: Not on file  . Transportation needs:    Medical: Not on file    Non-medical: Not on file  Tobacco Use  . Smoking status: Former Smoker    Last attempt to quit: 08/05/1981    Years since quitting: 36.2  . Smokeless tobacco: Never Used  Substance and Sexual Activity  . Alcohol use: No  . Drug use: No  . Sexual activity: Not on file  Lifestyle  . Physical activity:    Days per week: Not on file    Minutes per session: Not on file  . Stress: Not on file  Relationships  . Social connections:    Talks on phone: Not on file    Gets together: Not on file    Attends religious service: Not on file    Active member of club or organization: Not on file    Attends meetings of clubs or organizations: Not on file    Relationship status: Not on file  . Intimate partner violence:    Fear of current or ex partner: Not on file    Emotionally abused: Not on file    Physically abused: Not on file    Forced sexual activity: Not on file  Other Topics Concern  . Not on file  Social History Narrative   2 caffeine drinks daily. (Currently off.)     BP (!) 142/86   Pulse 61   Ht 6\' 1"  (1.854 m)   Wt 197 lb (89.4 kg)   BMI 25.99 kg/m   Physical Exam:  Well appearing NAD HEENT: Unremarkable Neck:  No JVD, no thyromegally Lymphatics:  No adenopathy Back:  No CVA tenderness Lungs:  Clear with no wheezes HEART:  Regular rate rhythm, no  murmurs, no rubs, no clicks Abd:  soft, positive bowel sounds, no organomegally, no rebound, no guarding Ext:  2 plus pulses, no edema, no cyanosis, no clubbing Skin:  No rashes no nodules Neuro:  CN II through XII intact, motor grossly intact  EKG - nsr  DEVICE  Normal device function.  See PaceArt for details.   Assess/Plan: 1. Episodic HTN - the patient symptoms continue and I have asked him to increase his norvasc to 10 mg daily. He is encouraged to reduce his salt intake. 2. Palpitations - he appears to have had some improvement. He will continue propafenone. 3. WpW - he has no evidence of AP conduction.   Mikle Bosworth.D.

## 2017-11-24 DIAGNOSIS — I10 Essential (primary) hypertension: Secondary | ICD-10-CM | POA: Diagnosis not present

## 2017-11-24 DIAGNOSIS — R079 Chest pain, unspecified: Secondary | ICD-10-CM | POA: Diagnosis not present

## 2017-11-24 DIAGNOSIS — I456 Pre-excitation syndrome: Secondary | ICD-10-CM | POA: Diagnosis not present

## 2017-11-24 DIAGNOSIS — F17200 Nicotine dependence, unspecified, uncomplicated: Secondary | ICD-10-CM | POA: Diagnosis not present

## 2017-11-25 DIAGNOSIS — I456 Pre-excitation syndrome: Secondary | ICD-10-CM | POA: Diagnosis not present

## 2017-11-25 DIAGNOSIS — F17200 Nicotine dependence, unspecified, uncomplicated: Secondary | ICD-10-CM | POA: Diagnosis not present

## 2017-11-25 DIAGNOSIS — I10 Essential (primary) hypertension: Secondary | ICD-10-CM | POA: Diagnosis not present

## 2017-11-25 DIAGNOSIS — R739 Hyperglycemia, unspecified: Secondary | ICD-10-CM | POA: Diagnosis not present

## 2017-11-25 DIAGNOSIS — R079 Chest pain, unspecified: Secondary | ICD-10-CM | POA: Diagnosis not present

## 2018-01-07 ENCOUNTER — Encounter: Payer: Self-pay | Admitting: Internal Medicine

## 2018-01-29 ENCOUNTER — Encounter: Payer: Self-pay | Admitting: Internal Medicine

## 2018-01-29 ENCOUNTER — Ambulatory Visit: Payer: BLUE CROSS/BLUE SHIELD | Admitting: Internal Medicine

## 2018-01-29 VITALS — BP 138/84 | HR 52 | Ht 73.5 in | Wt 191.4 lb

## 2018-01-29 DIAGNOSIS — I456 Pre-excitation syndrome: Secondary | ICD-10-CM | POA: Diagnosis not present

## 2018-01-29 DIAGNOSIS — R002 Palpitations: Secondary | ICD-10-CM | POA: Diagnosis not present

## 2018-01-29 DIAGNOSIS — N529 Male erectile dysfunction, unspecified: Secondary | ICD-10-CM

## 2018-01-29 MED ORDER — SILDENAFIL CITRATE 20 MG PO TABS: 40.0000 mg | ORAL_TABLET | Freq: Every day | ORAL | 2 refills | Status: DC | PRN

## 2018-01-29 NOTE — Progress Notes (Signed)
HPI [name] returns today for followup. He is a pleasant 62 yo man with a remote h/o WPW who's pre-excitation has resolved. However, he has had palpitations which were due to PAC's and PVC's and for which his symptoms have been well controlled with rhythmol. He notes occaisional break through palpitations. He also c/o ED. He notes mild peripheral edema since we increased his amlodipine but his bp has been better. Allergies  Allergen Reactions  . Vicodin [Hydrocodone-Acetaminophen] Other (See Comments)    Shortness of breath     Current Outpatient Medications  Medication Sig Dispense Refill  . amLODipine (NORVASC) 10 MG tablet Take 1 tablet (10 mg total) by mouth daily. 180 tablet 3  . Cholecalciferol (VITAMIN D PO) Take 500 mg by mouth daily.     . Omega-3 Fatty Acids (FISH OIL PO) Take 1 tablet by mouth daily.    . Omeprazole (PRILOSEC PO) Take 20 mg by mouth daily.     . propafenone (RYTHMOL) 225 MG tablet Take 1 tablet (225 mg total) by mouth 2 (two) times daily. 60 tablet 11   No current facility-administered medications for this visit.      Past Medical History:  Diagnosis Date  . Anxiety   . Arthritis   . Esophageal reflux 05/15/1999  . Family hx colonic polyps   . Hemorrhoids   . Hiatal hernia 05/15/1999  . IBS (irritable bowel syndrome)   . 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 Rectal fissure 04/30/1999  . Wolff-Parkinson-White (WPW) syndrome     ROS:   All systems reviewed and negative except as noted in the HPI.   Past Surgical History:  Procedure Laterality Date  . BACK SURGERY     x 2   . COLONOSCOPY    . KNEE SURGERY     Bilateral   . POLYPECTOMY       Family History  Problem Relation Age of Onset  . Colon polyps Brother   . Coronary artery disease Brother 72       Stent  . Heart disease Brother   . Colon cancer Neg Hx      Social History   Socioeconomic History  . Marital status: Married   Spouse name: Not on file  . Number of children: 1  . Years of education: Not on file  . Highest education level: Not on file  Occupational History  . Occupation: Sports administrator: [employer]  . Financial resource strain: Not on file  . Food insecurity:    Worry: Not on file    Inability: Not on file  . Transportation needs:    Medical: Not on file    Non-medical: Not on file  Tobacco Use  . Smoking status: Former Smoker    Last attempt to quit: 08/05/1981    Years since quitting: 36.5  . Smokeless tobacco: Never Used  Substance and Sexual Activity  . Alcohol use: No  . Drug use: No  . Sexual activity: Not on file  Lifestyle  . Physical activity:    Days per week: Not on file    Minutes per session: Not on file  . Stress: Not on file  Relationships  . Social connections:    Talks on phone: Not on file    Gets together: Not on file    Attends religious service: Not on file    Active member of club or organization:  Not on file    Attends meetings of clubs or organizations: Not on file    Relationship status: Not on file  . Intimate partner violence:    Fear of current or ex partner: Not on file    Emotionally abused: Not on file    Physically abused: Not on file    Forced sexual activity: Not on file  Other Topics Concern  . Not on file  Social History Narrative   2 caffeine drinks daily. (Currently off.)     BP 138/84   Pulse (!) 52   Ht 6' 1.5" (1.867 m)   Wt 191 lb 6.4 oz (86.8 kg)   SpO2 99%   BMI 24.91 kg/m   Physical Exam:  Well appearing 63 yo man, NAD HEENT: Unremarkable Neck:  6 cm JVD, no thyromegally Lymphatics:  No adenopathy Back:  No CVA tenderness Lungs:  Clear with no wheezes HEART:  Regular rate rhythm, no murmurs, no rubs, no clicks Abd:  soft, positive bowel sounds, no organomegally, no rebound, no guarding Ext:  2 plus pulses, no edema, no cyanosis, no clubbing Skin:  No rashes no nodules Neuro:  CN II  through XII intact, motor grossly intact  EKG - sinus bradycardia   Assess/Plan: 1. Palpitations - his symptoms are mostly well controlled. I instructed him to take an additional rhythmol if he has breakthrough symptoms. 2. WPW - he has no evidence of any pre-excitation on his ECG today. 3. HTN - his blood pressure is reasonably well controlled. 4. Peripheral edema - his legs look good this morning. I encouraged him to avoid his sodium intake. If his leg swelling worsens then we might stop amlodipine and switch to a different medication.  Mikle Bosworth.D.

## 2018-01-29 NOTE — Patient Instructions (Addendum)
Medication Instructions:  Your physician has recommended you make the following change in your medication:   1.  Start taking sildenafil 20 mg-  Take 2-3 tablets as needed daily.  Labwork: None ordered.  Testing/Procedures: None ordered.  Follow-Up: Your physician wants you to follow-up in: 6 months with Dr. Lovena Le.   You will receive a reminder letter in the mail two months in advance. If you don't receive a letter, please call our office to schedule the follow-up appointment.   Any Other Special Instructions Will Be Listed Below (If Applicable).  If you need a refill on your cardiac medications before your next appointment, please call your pharmacy.

## 2018-02-07 DIAGNOSIS — R61 Generalized hyperhidrosis: Secondary | ICD-10-CM | POA: Diagnosis not present

## 2018-02-07 DIAGNOSIS — I517 Cardiomegaly: Secondary | ICD-10-CM | POA: Diagnosis not present

## 2018-02-07 DIAGNOSIS — I456 Pre-excitation syndrome: Secondary | ICD-10-CM | POA: Diagnosis not present

## 2018-02-07 DIAGNOSIS — I493 Ventricular premature depolarization: Secondary | ICD-10-CM | POA: Diagnosis not present

## 2018-02-07 DIAGNOSIS — I471 Supraventricular tachycardia: Secondary | ICD-10-CM | POA: Diagnosis not present

## 2018-02-07 DIAGNOSIS — R42 Dizziness and giddiness: Secondary | ICD-10-CM | POA: Diagnosis not present

## 2018-02-07 DIAGNOSIS — R079 Chest pain, unspecified: Secondary | ICD-10-CM | POA: Diagnosis not present

## 2018-02-07 DIAGNOSIS — Z87891 Personal history of nicotine dependence: Secondary | ICD-10-CM | POA: Diagnosis not present

## 2018-02-07 DIAGNOSIS — M1990 Unspecified osteoarthritis, unspecified site: Secondary | ICD-10-CM | POA: Diagnosis not present

## 2018-02-07 DIAGNOSIS — K219 Gastro-esophageal reflux disease without esophagitis: Secondary | ICD-10-CM | POA: Diagnosis not present

## 2018-02-07 DIAGNOSIS — I10 Essential (primary) hypertension: Secondary | ICD-10-CM | POA: Diagnosis not present

## 2018-02-07 DIAGNOSIS — M7989 Other specified soft tissue disorders: Secondary | ICD-10-CM | POA: Diagnosis not present

## 2018-02-07 DIAGNOSIS — F419 Anxiety disorder, unspecified: Secondary | ICD-10-CM | POA: Diagnosis not present

## 2018-02-07 DIAGNOSIS — R51 Headache: Secondary | ICD-10-CM | POA: Diagnosis not present

## 2018-02-07 DIAGNOSIS — R0789 Other chest pain: Secondary | ICD-10-CM | POA: Diagnosis not present

## 2018-02-08 DIAGNOSIS — R931 Abnormal findings on diagnostic imaging of heart and coronary circulation: Secondary | ICD-10-CM | POA: Diagnosis not present

## 2018-02-08 DIAGNOSIS — I517 Cardiomegaly: Secondary | ICD-10-CM | POA: Diagnosis not present

## 2018-02-08 DIAGNOSIS — R0789 Other chest pain: Secondary | ICD-10-CM | POA: Diagnosis not present

## 2018-02-08 DIAGNOSIS — N529 Male erectile dysfunction, unspecified: Secondary | ICD-10-CM | POA: Diagnosis not present

## 2018-02-08 DIAGNOSIS — R079 Chest pain, unspecified: Secondary | ICD-10-CM | POA: Diagnosis not present

## 2018-02-08 DIAGNOSIS — I4891 Unspecified atrial fibrillation: Secondary | ICD-10-CM | POA: Diagnosis not present

## 2018-02-08 DIAGNOSIS — K219 Gastro-esophageal reflux disease without esophagitis: Secondary | ICD-10-CM | POA: Diagnosis not present

## 2018-02-09 DIAGNOSIS — H60333 Swimmer's ear, bilateral: Secondary | ICD-10-CM | POA: Diagnosis not present

## 2018-02-12 ENCOUNTER — Telehealth: Payer: Self-pay

## 2018-02-12 NOTE — Telephone Encounter (Signed)
**Note De-Identified Levander Katzenstein Obfuscation** I have done a Sildenafil PA through covermymeds. Key: Jorge Ny

## 2018-02-16 DIAGNOSIS — R079 Chest pain, unspecified: Secondary | ICD-10-CM | POA: Diagnosis not present

## 2018-02-16 DIAGNOSIS — R9439 Abnormal result of other cardiovascular function study: Secondary | ICD-10-CM | POA: Diagnosis not present

## 2018-02-16 DIAGNOSIS — R0789 Other chest pain: Secondary | ICD-10-CM | POA: Diagnosis not present

## 2018-02-16 DIAGNOSIS — I10 Essential (primary) hypertension: Secondary | ICD-10-CM | POA: Diagnosis not present

## 2018-02-16 MED ORDER — SILDENAFIL CITRATE 25 MG PO TABS: 50.0000 mg | ORAL_TABLET | Freq: Every day | ORAL | 3 refills | Status: DC | PRN

## 2018-02-16 NOTE — Telephone Encounter (Signed)
Changed dosage per Dr. Taylor.  Sildenafil 25 mg tablets- take 2-3 tablets by mouth daily as needed for ED.  Prescription resubmitted. 

## 2018-02-16 NOTE — Telephone Encounter (Signed)
Letter received from [facility] via fax stating that they have denied the pts Sildenafil PA. 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  Will forward message to Dr Lovena Le and his nurse for advisement to the pt.

## 2018-02-17 ENCOUNTER — Other Ambulatory Visit: Payer: Self-pay

## 2018-02-17 ENCOUNTER — Telehealth: Payer: Self-pay

## 2018-02-17 DIAGNOSIS — R002 Palpitations: Secondary | ICD-10-CM

## 2018-02-17 NOTE — Telephone Encounter (Signed)
Emailed request for 14 day monitor for palpitations   Will place the order to [employer]

## 2018-02-18 DIAGNOSIS — I259 Chronic ischemic heart disease, unspecified: Secondary | ICD-10-CM | POA: Diagnosis not present

## 2018-02-18 DIAGNOSIS — I456 Pre-excitation syndrome: Secondary | ICD-10-CM | POA: Diagnosis not present

## 2018-02-18 DIAGNOSIS — R232 Flushing: Secondary | ICD-10-CM | POA: Diagnosis not present

## 2018-02-18 DIAGNOSIS — R42 Dizziness and giddiness: Secondary | ICD-10-CM | POA: Diagnosis not present

## 2018-02-18 DIAGNOSIS — I471 Supraventricular tachycardia: Secondary | ICD-10-CM | POA: Diagnosis not present

## 2018-02-19 DIAGNOSIS — I493 Ventricular premature depolarization: Secondary | ICD-10-CM | POA: Diagnosis not present

## 2018-02-19 DIAGNOSIS — R110 Nausea: Secondary | ICD-10-CM | POA: Diagnosis not present

## 2018-02-19 DIAGNOSIS — Z79899 Other long term (current) drug therapy: Secondary | ICD-10-CM | POA: Diagnosis not present

## 2018-02-19 DIAGNOSIS — R0789 Other chest pain: Secondary | ICD-10-CM | POA: Diagnosis not present

## 2018-02-19 DIAGNOSIS — E785 Hyperlipidemia, unspecified: Secondary | ICD-10-CM | POA: Diagnosis not present

## 2018-02-19 DIAGNOSIS — I471 Supraventricular tachycardia: Secondary | ICD-10-CM | POA: Diagnosis not present

## 2018-02-19 DIAGNOSIS — Z87891 Personal history of nicotine dependence: Secondary | ICD-10-CM | POA: Diagnosis not present

## 2018-02-19 DIAGNOSIS — Z6824 Body mass index (BMI) 24.0-24.9, adult: Secondary | ICD-10-CM | POA: Diagnosis not present

## 2018-02-19 DIAGNOSIS — E669 Obesity, unspecified: Secondary | ICD-10-CM | POA: Diagnosis not present

## 2018-02-19 DIAGNOSIS — K219 Gastro-esophageal reflux disease without esophagitis: Secondary | ICD-10-CM | POA: Diagnosis not present

## 2018-02-19 DIAGNOSIS — I259 Chronic ischemic heart disease, unspecified: Secondary | ICD-10-CM | POA: Diagnosis not present

## 2018-02-19 DIAGNOSIS — R079 Chest pain, unspecified: Secondary | ICD-10-CM | POA: Diagnosis not present

## 2018-02-19 DIAGNOSIS — I10 Essential (primary) hypertension: Secondary | ICD-10-CM | POA: Diagnosis not present

## 2018-02-19 DIAGNOSIS — I2510 Atherosclerotic heart disease of native coronary artery without angina pectoris: Secondary | ICD-10-CM | POA: Diagnosis not present

## 2018-02-19 DIAGNOSIS — Z86010 Personal history of colonic polyps: Secondary | ICD-10-CM | POA: Diagnosis not present

## 2018-02-19 DIAGNOSIS — I491 Atrial premature depolarization: Secondary | ICD-10-CM | POA: Diagnosis not present

## 2018-02-19 DIAGNOSIS — R0602 Shortness of breath: Secondary | ICD-10-CM | POA: Diagnosis not present

## 2018-02-19 DIAGNOSIS — I200 Unstable angina: Secondary | ICD-10-CM | POA: Diagnosis not present

## 2018-02-24 ENCOUNTER — Other Ambulatory Visit (HOSPITAL_COMMUNITY): Payer: Self-pay | Admitting: Pulmonary Disease

## 2018-02-24 DIAGNOSIS — R079 Chest pain, unspecified: Secondary | ICD-10-CM | POA: Diagnosis not present

## 2018-02-24 DIAGNOSIS — K219 Gastro-esophageal reflux disease without esophagitis: Secondary | ICD-10-CM | POA: Diagnosis not present

## 2018-02-24 DIAGNOSIS — R109 Unspecified abdominal pain: Secondary | ICD-10-CM

## 2018-02-24 DIAGNOSIS — I456 Pre-excitation syndrome: Secondary | ICD-10-CM | POA: Diagnosis not present

## 2018-02-26 ENCOUNTER — Ambulatory Visit (HOSPITAL_COMMUNITY)
Admission: RE | Admit: 2018-02-26 | Discharge: 2018-02-26 | Disposition: A | Discharge status: Home / Self Care | Payer: BLUE CROSS/BLUE SHIELD | Source: Ambulatory Visit | Attending: Pulmonary Disease | Admitting: Pulmonary Disease

## 2018-02-26 DIAGNOSIS — Z1389 Encounter for screening for other disorder: Secondary | ICD-10-CM | POA: Diagnosis not present

## 2018-02-26 DIAGNOSIS — K760 Fatty (change of) liver, not elsewhere classified: Secondary | ICD-10-CM | POA: Diagnosis not present

## 2018-02-26 DIAGNOSIS — R109 Unspecified abdominal pain: Secondary | ICD-10-CM | POA: Diagnosis not present

## 2018-02-26 DIAGNOSIS — F4320 Adjustment disorder, unspecified: Secondary | ICD-10-CM | POA: Diagnosis not present

## 2018-02-26 DIAGNOSIS — I209 Angina pectoris, unspecified: Secondary | ICD-10-CM | POA: Diagnosis not present

## 2018-02-26 DIAGNOSIS — Z6824 Body mass index (BMI) 24.0-24.9, adult: Secondary | ICD-10-CM | POA: Diagnosis not present

## 2018-03-04 ENCOUNTER — Telehealth: Payer: Self-pay

## 2018-03-04 NOTE — Telephone Encounter (Signed)
Received email from [place] that patient asked to cancel event monitor. I called patient, he states he has transferred care to [facility]

## 2018-03-05 DIAGNOSIS — Z6824 Body mass index (BMI) 24.0-24.9, adult: Secondary | ICD-10-CM | POA: Diagnosis not present

## 2018-03-05 DIAGNOSIS — I456 Pre-excitation syndrome: Secondary | ICD-10-CM | POA: Diagnosis not present

## 2018-03-25 DIAGNOSIS — R51 Headache: Secondary | ICD-10-CM | POA: Diagnosis not present

## 2018-04-07 DIAGNOSIS — F419 Anxiety disorder, unspecified: Secondary | ICD-10-CM | POA: Diagnosis not present

## 2018-04-07 DIAGNOSIS — R51 Headache: Secondary | ICD-10-CM | POA: Diagnosis not present

## 2018-04-07 DIAGNOSIS — K219 Gastro-esophageal reflux disease without esophagitis: Secondary | ICD-10-CM | POA: Diagnosis not present

## 2018-04-07 DIAGNOSIS — I456 Pre-excitation syndrome: Secondary | ICD-10-CM | POA: Diagnosis not present

## 2018-04-29 DIAGNOSIS — R079 Chest pain, unspecified: Secondary | ICD-10-CM | POA: Diagnosis not present

## 2018-04-29 DIAGNOSIS — Z87891 Personal history of nicotine dependence: Secondary | ICD-10-CM | POA: Diagnosis not present

## 2018-04-29 DIAGNOSIS — I456 Pre-excitation syndrome: Secondary | ICD-10-CM | POA: Diagnosis not present

## 2018-04-29 DIAGNOSIS — Z8249 Family history of ischemic heart disease and other diseases of the circulatory system: Secondary | ICD-10-CM | POA: Diagnosis not present

## 2018-04-29 DIAGNOSIS — R42 Dizziness and giddiness: Secondary | ICD-10-CM | POA: Diagnosis not present

## 2018-04-29 DIAGNOSIS — R002 Palpitations: Secondary | ICD-10-CM | POA: Diagnosis not present

## 2018-04-29 DIAGNOSIS — R51 Headache: Secondary | ICD-10-CM | POA: Diagnosis not present

## 2018-05-14 DIAGNOSIS — I493 Ventricular premature depolarization: Secondary | ICD-10-CM | POA: Diagnosis not present

## 2018-05-14 DIAGNOSIS — R002 Palpitations: Secondary | ICD-10-CM | POA: Diagnosis not present

## 2018-05-17 DIAGNOSIS — Z87891 Personal history of nicotine dependence: Secondary | ICD-10-CM | POA: Diagnosis not present

## 2018-05-17 DIAGNOSIS — R002 Palpitations: Secondary | ICD-10-CM | POA: Diagnosis not present

## 2018-05-17 DIAGNOSIS — I493 Ventricular premature depolarization: Secondary | ICD-10-CM | POA: Diagnosis not present

## 2018-05-17 DIAGNOSIS — K219 Gastro-esophageal reflux disease without esophagitis: Secondary | ICD-10-CM | POA: Diagnosis not present

## 2018-05-17 DIAGNOSIS — I456 Pre-excitation syndrome: Secondary | ICD-10-CM | POA: Diagnosis not present

## 2018-05-17 DIAGNOSIS — Z6824 Body mass index (BMI) 24.0-24.9, adult: Secondary | ICD-10-CM | POA: Diagnosis not present

## 2018-05-17 DIAGNOSIS — I10 Essential (primary) hypertension: Secondary | ICD-10-CM | POA: Diagnosis not present

## 2018-05-27 DIAGNOSIS — R002 Palpitations: Secondary | ICD-10-CM | POA: Diagnosis not present

## 2018-05-27 DIAGNOSIS — I10 Essential (primary) hypertension: Secondary | ICD-10-CM | POA: Diagnosis not present

## 2018-05-27 DIAGNOSIS — Z8679 Personal history of other diseases of the circulatory system: Secondary | ICD-10-CM | POA: Diagnosis not present

## 2018-06-11 DIAGNOSIS — F419 Anxiety disorder, unspecified: Secondary | ICD-10-CM | POA: Diagnosis not present

## 2018-06-11 DIAGNOSIS — K219 Gastro-esophageal reflux disease without esophagitis: Secondary | ICD-10-CM | POA: Diagnosis not present

## 2018-06-11 DIAGNOSIS — I10 Essential (primary) hypertension: Secondary | ICD-10-CM | POA: Diagnosis not present

## 2018-06-11 DIAGNOSIS — G629 Polyneuropathy, unspecified: Secondary | ICD-10-CM | POA: Diagnosis not present

## 2018-06-18 DIAGNOSIS — Z4509 Encounter for adjustment and management of other cardiac device: Secondary | ICD-10-CM | POA: Diagnosis not present

## 2018-06-18 DIAGNOSIS — R079 Chest pain, unspecified: Secondary | ICD-10-CM | POA: Diagnosis not present

## 2018-06-18 DIAGNOSIS — R002 Palpitations: Secondary | ICD-10-CM | POA: Diagnosis not present

## 2018-06-18 DIAGNOSIS — R42 Dizziness and giddiness: Secondary | ICD-10-CM | POA: Diagnosis not present

## 2018-07-19 DIAGNOSIS — Z4509 Encounter for adjustment and management of other cardiac device: Secondary | ICD-10-CM | POA: Diagnosis not present

## 2018-07-19 DIAGNOSIS — Z8679 Personal history of other diseases of the circulatory system: Secondary | ICD-10-CM | POA: Diagnosis not present

## 2018-08-17 DIAGNOSIS — R51 Headache: Secondary | ICD-10-CM | POA: Diagnosis not present

## 2018-08-19 DIAGNOSIS — Z4509 Encounter for adjustment and management of other cardiac device: Secondary | ICD-10-CM | POA: Diagnosis not present

## 2018-08-19 DIAGNOSIS — R002 Palpitations: Secondary | ICD-10-CM | POA: Diagnosis not present

## 2018-09-20 DIAGNOSIS — Z4509 Encounter for adjustment and management of other cardiac device: Secondary | ICD-10-CM | POA: Diagnosis not present

## 2018-09-20 DIAGNOSIS — I456 Pre-excitation syndrome: Secondary | ICD-10-CM | POA: Diagnosis not present

## 2018-09-23 ENCOUNTER — Telehealth: Payer: Self-pay | Admitting: Internal Medicine

## 2018-09-23 NOTE — Telephone Encounter (Signed)
New message   [facility] for pt to call back and schedule virtual visit with Dr. Curly Rim. Will offer doxemity video if pt has a smart phone.

## 2018-09-29 DIAGNOSIS — R51 Headache: Secondary | ICD-10-CM | POA: Diagnosis not present

## 2018-11-12 DIAGNOSIS — R42 Dizziness and giddiness: Secondary | ICD-10-CM | POA: Diagnosis not present

## 2018-11-18 DIAGNOSIS — R569 Unspecified convulsions: Secondary | ICD-10-CM | POA: Diagnosis not present

## 2018-11-26 ENCOUNTER — Other Ambulatory Visit: Payer: Self-pay | Admitting: Neurology

## 2018-11-26 DIAGNOSIS — I639 Cerebral infarction, unspecified: Secondary | ICD-10-CM

## 2018-11-26 DIAGNOSIS — R42 Dizziness and giddiness: Secondary | ICD-10-CM

## 2018-11-26 DIAGNOSIS — R4020 Unspecified coma: Secondary | ICD-10-CM

## 2018-12-01 ENCOUNTER — Other Ambulatory Visit: Payer: Self-pay

## 2018-12-01 ENCOUNTER — Ambulatory Visit
Admission: RE | Admit: 2018-12-01 | Discharge: 2018-12-01 | Disposition: A | Discharge status: Home / Self Care | Payer: BC Managed Care – PPO | Source: Ambulatory Visit | Attending: Neurology | Admitting: Neurology

## 2018-12-01 DIAGNOSIS — K219 Gastro-esophageal reflux disease without esophagitis: Secondary | ICD-10-CM | POA: Diagnosis not present

## 2018-12-01 DIAGNOSIS — Z87891 Personal history of nicotine dependence: Secondary | ICD-10-CM | POA: Diagnosis not present

## 2018-12-01 DIAGNOSIS — R42 Dizziness and giddiness: Secondary | ICD-10-CM | POA: Diagnosis not present

## 2018-12-01 DIAGNOSIS — Z6825 Body mass index (BMI) 25.0-25.9, adult: Secondary | ICD-10-CM | POA: Diagnosis not present

## 2018-12-01 DIAGNOSIS — I10 Essential (primary) hypertension: Secondary | ICD-10-CM | POA: Diagnosis not present

## 2018-12-01 DIAGNOSIS — R4020 Unspecified coma: Secondary | ICD-10-CM | POA: Diagnosis not present

## 2018-12-01 DIAGNOSIS — R600 Localized edema: Secondary | ICD-10-CM | POA: Diagnosis not present

## 2018-12-01 DIAGNOSIS — Z7982 Long term (current) use of aspirin: Secondary | ICD-10-CM | POA: Diagnosis not present

## 2018-12-01 DIAGNOSIS — I2510 Atherosclerotic heart disease of native coronary artery without angina pectoris: Secondary | ICD-10-CM | POA: Diagnosis not present

## 2018-12-01 DIAGNOSIS — I639 Cerebral infarction, unspecified: Secondary | ICD-10-CM | POA: Diagnosis not present

## 2018-12-01 DIAGNOSIS — R51 Headache: Secondary | ICD-10-CM | POA: Diagnosis not present

## 2018-12-07 DIAGNOSIS — I2510 Atherosclerotic heart disease of native coronary artery without angina pectoris: Secondary | ICD-10-CM | POA: Diagnosis not present

## 2018-12-07 DIAGNOSIS — F419 Anxiety disorder, unspecified: Secondary | ICD-10-CM | POA: Diagnosis not present

## 2018-12-07 DIAGNOSIS — R42 Dizziness and giddiness: Secondary | ICD-10-CM | POA: Diagnosis not present

## 2018-12-07 DIAGNOSIS — R55 Syncope and collapse: Secondary | ICD-10-CM | POA: Diagnosis not present

## 2018-12-07 DIAGNOSIS — Z79899 Other long term (current) drug therapy: Secondary | ICD-10-CM | POA: Diagnosis not present

## 2018-12-07 DIAGNOSIS — I10 Essential (primary) hypertension: Secondary | ICD-10-CM | POA: Diagnosis not present

## 2018-12-07 DIAGNOSIS — Z87891 Personal history of nicotine dependence: Secondary | ICD-10-CM | POA: Diagnosis not present

## 2018-12-07 DIAGNOSIS — K219 Gastro-esophageal reflux disease without esophagitis: Secondary | ICD-10-CM | POA: Diagnosis not present

## 2018-12-07 DIAGNOSIS — Z7982 Long term (current) use of aspirin: Secondary | ICD-10-CM | POA: Diagnosis not present

## 2018-12-07 DIAGNOSIS — Z86010 Personal history of colonic polyps: Secondary | ICD-10-CM | POA: Diagnosis not present

## 2018-12-24 DIAGNOSIS — R002 Palpitations: Secondary | ICD-10-CM | POA: Diagnosis not present

## 2019-01-13 DIAGNOSIS — Z79899 Other long term (current) drug therapy: Secondary | ICD-10-CM | POA: Diagnosis not present

## 2019-01-13 DIAGNOSIS — G901 Familial dysautonomia [Riley-Day]: Secondary | ICD-10-CM | POA: Diagnosis not present

## 2019-01-13 DIAGNOSIS — I10 Essential (primary) hypertension: Secondary | ICD-10-CM | POA: Diagnosis not present

## 2019-01-13 DIAGNOSIS — K219 Gastro-esophageal reflux disease without esophagitis: Secondary | ICD-10-CM | POA: Diagnosis not present

## 2019-01-13 DIAGNOSIS — Z6825 Body mass index (BMI) 25.0-25.9, adult: Secondary | ICD-10-CM | POA: Diagnosis not present

## 2019-01-13 DIAGNOSIS — Z4509 Encounter for adjustment and management of other cardiac device: Secondary | ICD-10-CM | POA: Diagnosis not present

## 2019-01-13 DIAGNOSIS — Z87891 Personal history of nicotine dependence: Secondary | ICD-10-CM | POA: Diagnosis not present

## 2019-02-18 DIAGNOSIS — R69 Illness, unspecified: Secondary | ICD-10-CM | POA: Diagnosis not present

## 2019-03-01 ENCOUNTER — Other Ambulatory Visit: Payer: Self-pay | Admitting: Internal Medicine

## 2019-03-14 DIAGNOSIS — L562 Photocontact dermatitis [berloque dermatitis]: Secondary | ICD-10-CM | POA: Diagnosis not present

## 2019-03-18 DIAGNOSIS — I10 Essential (primary) hypertension: Secondary | ICD-10-CM | POA: Diagnosis not present

## 2019-03-18 DIAGNOSIS — R21 Rash and other nonspecific skin eruption: Secondary | ICD-10-CM | POA: Diagnosis not present

## 2019-03-18 DIAGNOSIS — Z6826 Body mass index (BMI) 26.0-26.9, adult: Secondary | ICD-10-CM | POA: Diagnosis not present

## 2019-03-25 DIAGNOSIS — Z4509 Encounter for adjustment and management of other cardiac device: Secondary | ICD-10-CM | POA: Diagnosis not present

## 2019-04-21 DIAGNOSIS — Z79899 Other long term (current) drug therapy: Secondary | ICD-10-CM | POA: Diagnosis not present

## 2019-04-21 DIAGNOSIS — Z6826 Body mass index (BMI) 26.0-26.9, adult: Secondary | ICD-10-CM | POA: Diagnosis not present

## 2019-04-21 DIAGNOSIS — G909 Disorder of the autonomic nervous system, unspecified: Secondary | ICD-10-CM | POA: Diagnosis not present

## 2019-04-21 DIAGNOSIS — Z87891 Personal history of nicotine dependence: Secondary | ICD-10-CM | POA: Diagnosis not present

## 2019-04-21 DIAGNOSIS — I493 Ventricular premature depolarization: Secondary | ICD-10-CM | POA: Diagnosis not present

## 2019-04-21 DIAGNOSIS — Z888 Allergy status to other drugs, medicaments and biological substances status: Secondary | ICD-10-CM | POA: Diagnosis not present

## 2019-04-21 DIAGNOSIS — R21 Rash and other nonspecific skin eruption: Secondary | ICD-10-CM | POA: Diagnosis not present

## 2019-04-21 DIAGNOSIS — Z885 Allergy status to narcotic agent status: Secondary | ICD-10-CM | POA: Diagnosis not present

## 2019-04-21 DIAGNOSIS — I10 Essential (primary) hypertension: Secondary | ICD-10-CM | POA: Diagnosis not present

## 2019-04-21 DIAGNOSIS — I456 Pre-excitation syndrome: Secondary | ICD-10-CM | POA: Diagnosis not present

## 2019-04-25 DIAGNOSIS — L561 Drug photoallergic response: Secondary | ICD-10-CM | POA: Diagnosis not present

## 2019-04-25 DIAGNOSIS — L308 Other specified dermatitis: Secondary | ICD-10-CM | POA: Diagnosis not present

## 2019-05-11 DIAGNOSIS — R21 Rash and other nonspecific skin eruption: Secondary | ICD-10-CM | POA: Diagnosis not present

## 2019-05-11 DIAGNOSIS — I10 Essential (primary) hypertension: Secondary | ICD-10-CM | POA: Diagnosis not present

## 2019-05-11 DIAGNOSIS — Z6825 Body mass index (BMI) 25.0-25.9, adult: Secondary | ICD-10-CM | POA: Diagnosis not present

## 2019-05-11 DIAGNOSIS — G901 Familial dysautonomia [Riley-Day]: Secondary | ICD-10-CM | POA: Diagnosis not present

## 2019-05-16 DIAGNOSIS — R21 Rash and other nonspecific skin eruption: Secondary | ICD-10-CM | POA: Diagnosis not present

## 2019-05-16 DIAGNOSIS — L931 Subacute cutaneous lupus erythematosus: Secondary | ICD-10-CM | POA: Diagnosis not present

## 2019-05-16 DIAGNOSIS — Z79899 Other long term (current) drug therapy: Secondary | ICD-10-CM | POA: Diagnosis not present

## 2019-05-26 DIAGNOSIS — Z79899 Other long term (current) drug therapy: Secondary | ICD-10-CM | POA: Diagnosis not present

## 2019-05-26 DIAGNOSIS — L931 Subacute cutaneous lupus erythematosus: Secondary | ICD-10-CM | POA: Diagnosis not present

## 2019-05-26 DIAGNOSIS — M329 Systemic lupus erythematosus, unspecified: Secondary | ICD-10-CM | POA: Diagnosis not present

## 2019-06-08 DIAGNOSIS — L931 Subacute cutaneous lupus erythematosus: Secondary | ICD-10-CM | POA: Diagnosis not present

## 2019-06-13 DIAGNOSIS — R21 Rash and other nonspecific skin eruption: Secondary | ICD-10-CM | POA: Diagnosis not present

## 2019-06-13 DIAGNOSIS — L931 Subacute cutaneous lupus erythematosus: Secondary | ICD-10-CM | POA: Diagnosis not present

## 2019-06-13 DIAGNOSIS — Z79899 Other long term (current) drug therapy: Secondary | ICD-10-CM | POA: Diagnosis not present

## 2019-06-24 DIAGNOSIS — Z4509 Encounter for adjustment and management of other cardiac device: Secondary | ICD-10-CM | POA: Diagnosis not present

## 2019-06-24 DIAGNOSIS — R55 Syncope and collapse: Secondary | ICD-10-CM | POA: Diagnosis not present

## 2019-07-22 DIAGNOSIS — Z6825 Body mass index (BMI) 25.0-25.9, adult: Secondary | ICD-10-CM | POA: Diagnosis not present

## 2019-07-22 DIAGNOSIS — R7309 Other abnormal glucose: Secondary | ICD-10-CM | POA: Diagnosis not present

## 2019-07-22 DIAGNOSIS — I10 Essential (primary) hypertension: Secondary | ICD-10-CM | POA: Diagnosis not present

## 2019-07-22 DIAGNOSIS — I493 Ventricular premature depolarization: Secondary | ICD-10-CM | POA: Diagnosis not present

## 2019-07-22 DIAGNOSIS — E039 Hypothyroidism, unspecified: Secondary | ICD-10-CM | POA: Diagnosis not present

## 2019-07-22 DIAGNOSIS — Z0000 Encounter for general adult medical examination without abnormal findings: Secondary | ICD-10-CM | POA: Diagnosis not present

## 2019-07-22 DIAGNOSIS — E7849 Other hyperlipidemia: Secondary | ICD-10-CM | POA: Diagnosis not present

## 2019-07-22 DIAGNOSIS — Z1389 Encounter for screening for other disorder: Secondary | ICD-10-CM | POA: Diagnosis not present

## 2019-07-22 DIAGNOSIS — Z125 Encounter for screening for malignant neoplasm of prostate: Secondary | ICD-10-CM | POA: Diagnosis not present

## 2019-07-22 DIAGNOSIS — I456 Pre-excitation syndrome: Secondary | ICD-10-CM | POA: Diagnosis not present

## 2019-07-22 DIAGNOSIS — E663 Overweight: Secondary | ICD-10-CM | POA: Diagnosis not present

## 2019-07-27 ENCOUNTER — Other Ambulatory Visit: Payer: Self-pay

## 2019-07-27 ENCOUNTER — Emergency Department
Admission: EM | Admit: 2019-07-27 | Discharge: 2019-07-27 | Disposition: A | Discharge status: Home / Self Care | Payer: Medicare HMO | Attending: Emergency Medicine | Admitting: Emergency Medicine

## 2019-07-27 ENCOUNTER — Encounter: Payer: Self-pay | Admitting: Emergency Medicine

## 2019-07-27 ENCOUNTER — Emergency Department: Payer: Medicare HMO

## 2019-07-27 DIAGNOSIS — I10 Essential (primary) hypertension: Secondary | ICD-10-CM | POA: Diagnosis not present

## 2019-07-27 DIAGNOSIS — Z87891 Personal history of nicotine dependence: Secondary | ICD-10-CM | POA: Diagnosis not present

## 2019-07-27 DIAGNOSIS — Z79899 Other long term (current) drug therapy: Secondary | ICD-10-CM | POA: Insufficient documentation

## 2019-07-27 DIAGNOSIS — R079 Chest pain, unspecified: Secondary | ICD-10-CM | POA: Diagnosis not present

## 2019-07-27 LAB — BASIC METABOLIC PANEL
Anion gap: 9 (ref 5–15)
BUN: 16 mg/dL (ref 8–23)
CO2: 27 mmol/L (ref 22–32)
Calcium: 9.8 mg/dL (ref 8.9–10.3)
Chloride: 100 mmol/L (ref 98–111)
Creatinine, Ser: 0.97 mg/dL (ref 0.61–1.24)
GFR calc Af Amer: >60 mL/min (ref >60)
GFR calc non Af Amer: >60 mL/min (ref >60)
Glucose, Bld: 119 mg/dL — ABNORMAL HIGH (ref 70–99)
Potassium: 3.8 mmol/L (ref 3.5–5.1)
Sodium: 136 mmol/L (ref 135–145)

## 2019-07-27 LAB — CBC
HCT: 38.3 % — ABNORMAL LOW (ref 39.0–52.0)
Hemoglobin: 13.4 g/dL (ref 13.0–17.0)
MCH: 31 pg (ref 26.0–34.0)
MCHC: 35 g/dL (ref 30.0–36.0)
MCV: 88.7 fL (ref 80.0–100.0)
Platelets: 202 10*3/uL (ref 150–400)
RBC: 4.32 MIL/uL (ref 4.22–5.81)
RDW: 12.5 % (ref 11.5–15.5)
WBC: 8.5 10*3/uL (ref 4.0–10.5)
nRBC: 0 % (ref 0.0–0.2)

## 2019-07-27 LAB — TROPONIN I (HIGH SENSITIVITY): Troponin I (High Sensitivity): 5 ng/L (ref <18)

## 2019-07-27 MED ORDER — LIDOCAINE VISCOUS HCL 2 % MT SOLN
15.0000 mL | Freq: Once | OROMUCOSAL | Status: AC
  Administered 2019-07-27: 15 mL via OROMUCOSAL
  Filled 2019-07-27: qty 15

## 2019-07-27 MED ORDER — ONDANSETRON 4 MG PO TBDP: 4.0000 mg | ORAL_TABLET | Freq: Once | ORAL | Status: AC

## 2019-07-27 MED ORDER — SUCRALFATE 1 G PO TABS: 1.0000 g | ORAL_TABLET | Freq: Four times a day (QID) | ORAL | 0 refills | Status: DC

## 2019-07-27 MED ORDER — ONDANSETRON 4 MG PO TBDP
ORAL_TABLET | ORAL | Status: AC
  Administered 2019-07-27: 4 mg via ORAL
  Filled 2019-07-27: qty 1

## 2019-07-27 NOTE — ED Triage Notes (Signed)
Pt in via POV, reports left side chest pain with radiation into left shoulder x 1 day, denies accompanying symptoms.  NAD noted at this time.

## 2019-07-27 NOTE — ED Notes (Signed)
Pt c/o nausea.  

## 2019-07-27 NOTE — ED Provider Notes (Signed)
[facility] Emergency Department Provider Note   ____________________________________________   I have reviewed the triage vital signs and the nursing notes.   HISTORY  Chief Complaint Chest Pain   History limited by: Not Limited   HPI Peter Flores is a 64 y.o. male who presents to the emergency department today because of concern for chest pain. Located in the left/center chest. Did start this morning. The patient tried taking mylanta without any relief. Denies any shortness of breath or diaphoresis. States that he did eat a spicy chicken sandwich two days ago. Had some pizza last night. Has history of acid reflux and takes antacids daily. The patient denies any recent fever or sick contacts.   Records reviewed. Per medical record review patient has a history of gerd.   Past Medical History:  Diagnosis Date  . Anxiety   . Arthritis   . Esophageal reflux 05/15/1999  . Family hx colonic polyps   . Hemorrhoids   . Hiatal hernia 05/15/1999  . IBS (irritable bowel syndrome)   . 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 Rectal fissure 04/30/1999  . Wolff-Parkinson-White (WPW) syndrome     Patient Active Problem List   Diagnosis Date Noted  . GERD (gastroesophageal reflux disease) 06/25/2017  . Heat cramps 12/02/2013  . WPW (Wolff-Parkinson-White syndrome) 08/06/2011  . HTN (hypertension) 08/06/2011  . Palpitation 08/06/2011  . Dyspnea 08/06/2011    Past Surgical History:  Procedure Laterality Date  . BACK SURGERY     x 2   . COLONOSCOPY    . KNEE SURGERY     Bilateral   . POLYPECTOMY      Prior to Admission medications   Medication Sig Start Date End Date Taking? Authorizing Provider  amLODipine (NORVASC) 10 MG tablet Take 1 tablet (10 mg total) by mouth daily. Please make overdue appt with Dr. Lovena Le before anymore refills. 1st attempt 03/03/19   Evans Lance, MD  Cholecalciferol (VITAMIN D PO) Take  500 mg by mouth daily.     [provider]  Omega-3 Fatty Acids (FISH OIL PO) Take 1 tablet by mouth daily.    [provider]  Omeprazole (PRILOSEC PO) Take 20 mg by mouth daily.     [provider]  propafenone (RYTHMOL) 225 MG tablet Take 1 tablet (225 mg total) by mouth 2 (two) times daily. 10/08/17   Evans Lance, MD  sildenafil (VIAGRA) 25 MG tablet Take 2-3 tablets (50-75 mg total) by mouth daily as needed for erectile dysfunction. 02/16/18   Evans Lance, MD    Allergies Vicodin [hydrocodone-acetaminophen]  Family History  Problem Relation Age of Onset  . Colon polyps Brother   . Coronary artery disease Brother 30       Stent  . Heart disease Brother   . Colon cancer Neg Hx     Social History Social History   Tobacco Use  . Smoking status: Former Smoker    Quit date: 08/05/1981    Years since quitting: 38.0  . Smokeless tobacco: Never Used  Substance Use Topics  . Alcohol use: No  . Drug use: No    Review of Systems Constitutional: No fever/chills Eyes: No visual changes. ENT: No sore throat. Cardiovascular: Positive for chest pain.  Respiratory: Denies shortness of breath. Gastrointestinal: No abdominal pain.  Positive for nausea.  Genitourinary: Negative for dysuria. Musculoskeletal: Negative for back pain. Skin: Negative for rash. Neurological: Negative for headaches, focal weakness  or numbness.  ____________________________________________   PHYSICAL EXAM:  VITAL SIGNS: ED Triage Vitals  Enc Vitals Group     BP 07/27/19 1818 (!) 147/80     Pulse Rate 07/27/19 1818 81     Resp 07/27/19 1818 16     Temp 07/27/19 1818 98.6 F (37 C)     Temp Source 07/27/19 1818 Oral     SpO2 07/27/19 1818 100 %     Weight 07/27/19 1814 200 lb (90.7 kg)     Height 07/27/19 1814 6\' 1"  (1.854 m)     Head Circumference --      Peak Flow --      Pain Score 07/27/19 1813 7   Constitutional: Alert and oriented.  Eyes: Conjunctivae are  normal.  ENT      Head: Normocephalic and atraumatic.      Nose: No congestion/rhinnorhea.      Mouth/Throat: Mucous membranes are moist.      Neck: No stridor. Cardiovascular: Normal rate, regular rhythm.  No murmurs, rubs, or gallops.  Respiratory: Normal respiratory effort without tachypnea nor retractions. Breath sounds are clear and equal bilaterally. No wheezes/rales/rhonchi. Gastrointestinal: Soft and non tender. No rebound. No guarding.  Genitourinary: Deferred Musculoskeletal: Normal range of motion in all extremities. No lower extremity edema. Neurologic:  Normal speech and language. No gross focal neurologic deficits are appreciated.  Skin:  Skin is warm, dry and intact. No rash noted. Psychiatric: Mood and affect are normal. Speech and behavior are normal. Patient exhibits appropriate insight and judgment.  ____________________________________________    LABS (pertinent positives/negatives)  Trop hs 5 CBC wbc 8.5, hgb 13.4, plt 202 BMP wnl except glu 119  ____________________________________________   EKG  I, Nance Pear, attending physician, personally viewed and interpreted this EKG  EKG Time: 1810 Rate: 83 Rhythm: normal sinus rhythm Axis: right superior axis deviation Intervals: qtc 462 QRS: Incomplete RBBB ST changes: no st elevation Impression: abnormal ekg  ____________________________________________    RADIOLOGY  CXR No acute abnormality  ____________________________________________   PROCEDURES  Procedures  ____________________________________________   INITIAL IMPRESSION / ASSESSMENT AND PLAN / ED COURSE  Pertinent labs & imaging results that were available during my care of the patient were reviewed by me and considered in my medical decision making (see chart for details).   Patient presented because of concern for left chest pain. The pain started this morning. Differential includes ACS, PTX, PNA, PE, esophagitis, dissection  amongst other etiologies. Troponin negative. Would expect it would be elevated if patient's symptoms were ACS. The patient did get good relief of his chest pain after viscous lidocaine. At this point I do think gastritis/esophagitis likely. Will plan on discharging home. Discussed importance of follow up.  ____________________________________________   FINAL CLINICAL IMPRESSION(S) / ED DIAGNOSES  Final diagnoses:  Nonspecific chest pain     Note: This dictation was prepared with Dragon dictation. Any transcriptional errors that result from this process are unintentional     Nance Pear, MD 07/27/19 2334

## 2019-07-27 NOTE — Discharge Instructions (Addendum)
Please seek medical attention for any high fevers, chest pain, shortness of breath, change in behavior, persistent vomiting, bloody stool or any other new or concerning symptoms.  

## 2019-08-04 DIAGNOSIS — R55 Syncope and collapse: Secondary | ICD-10-CM | POA: Diagnosis not present

## 2019-08-04 DIAGNOSIS — R21 Rash and other nonspecific skin eruption: Secondary | ICD-10-CM | POA: Diagnosis not present

## 2019-08-04 DIAGNOSIS — Z7982 Long term (current) use of aspirin: Secondary | ICD-10-CM | POA: Diagnosis not present

## 2019-08-04 DIAGNOSIS — Z79899 Other long term (current) drug therapy: Secondary | ICD-10-CM | POA: Diagnosis not present

## 2019-08-04 DIAGNOSIS — R110 Nausea: Secondary | ICD-10-CM | POA: Diagnosis not present

## 2019-08-04 DIAGNOSIS — Z4509 Encounter for adjustment and management of other cardiac device: Secondary | ICD-10-CM | POA: Diagnosis not present

## 2019-08-04 DIAGNOSIS — I10 Essential (primary) hypertension: Secondary | ICD-10-CM | POA: Diagnosis not present

## 2019-08-04 DIAGNOSIS — I456 Pre-excitation syndrome: Secondary | ICD-10-CM | POA: Diagnosis not present

## 2019-08-04 DIAGNOSIS — Z87891 Personal history of nicotine dependence: Secondary | ICD-10-CM | POA: Diagnosis not present

## 2019-08-04 DIAGNOSIS — G909 Disorder of the autonomic nervous system, unspecified: Secondary | ICD-10-CM | POA: Diagnosis not present

## 2019-08-23 DIAGNOSIS — R7309 Other abnormal glucose: Secondary | ICD-10-CM | POA: Diagnosis not present

## 2019-08-23 DIAGNOSIS — Z6825 Body mass index (BMI) 25.0-25.9, adult: Secondary | ICD-10-CM | POA: Diagnosis not present

## 2019-08-23 DIAGNOSIS — L931 Subacute cutaneous lupus erythematosus: Secondary | ICD-10-CM | POA: Diagnosis not present

## 2019-08-23 DIAGNOSIS — I493 Ventricular premature depolarization: Secondary | ICD-10-CM | POA: Diagnosis not present

## 2019-08-29 ENCOUNTER — Encounter (INDEPENDENT_AMBULATORY_CARE_PROVIDER_SITE_OTHER): Payer: Self-pay | Admitting: *Deleted

## 2019-09-23 DIAGNOSIS — Z4509 Encounter for adjustment and management of other cardiac device: Secondary | ICD-10-CM | POA: Diagnosis not present

## 2019-10-04 ENCOUNTER — Encounter (INDEPENDENT_AMBULATORY_CARE_PROVIDER_SITE_OTHER): Payer: Self-pay | Admitting: *Deleted

## 2019-10-04 ENCOUNTER — Other Ambulatory Visit (INDEPENDENT_AMBULATORY_CARE_PROVIDER_SITE_OTHER): Payer: Self-pay | Admitting: *Deleted

## 2019-10-05 ENCOUNTER — Other Ambulatory Visit (INDEPENDENT_AMBULATORY_CARE_PROVIDER_SITE_OTHER): Payer: Self-pay | Admitting: *Deleted

## 2019-10-05 DIAGNOSIS — Z1211 Encounter for screening for malignant neoplasm of colon: Secondary | ICD-10-CM

## 2019-10-10 DIAGNOSIS — L931 Subacute cutaneous lupus erythematosus: Secondary | ICD-10-CM | POA: Diagnosis not present

## 2019-10-10 DIAGNOSIS — Z79899 Other long term (current) drug therapy: Secondary | ICD-10-CM | POA: Diagnosis not present

## 2019-10-13 ENCOUNTER — Ambulatory Visit (INDEPENDENT_AMBULATORY_CARE_PROVIDER_SITE_OTHER): Payer: Self-pay

## 2019-10-13 ENCOUNTER — Telehealth (INDEPENDENT_AMBULATORY_CARE_PROVIDER_SITE_OTHER): Payer: Self-pay | Admitting: *Deleted

## 2019-10-13 ENCOUNTER — Other Ambulatory Visit: Payer: Self-pay

## 2019-10-13 MED ORDER — PLENVU 140 G PO SOLR: 1.0000 | Freq: Once | ORAL | 0 refills | Status: AC

## 2019-10-13 NOTE — Telephone Encounter (Signed)
Referring MD/PCP: golding   Procedure: tcs  Reason/Indication:  screening  Has patient had this procedure before?  Yes, over 10 yrs ago  If so, when, by whom and where?    Is there a family history of colon cancer?  no  Who?  What age when diagnosed?    Is patient diabetic?   no      Does patient have prosthetic heart valve or mechanical valve?  no  Do you have a pacemaker/defibrillator?  no  Has patient ever had endocarditis/atrial fibrillation? no  Does patient use oxygen? no  Has patient had joint replacement within last 12 months?  no  Is patient constipated or do they take laxatives? no  Does patient have a history of alcohol/drug use?  no  Is patient on blood thinner such as Coumadin, Plavix and/or Aspirin? yes  Medications: asa 81 mg daily, tylenol 500 mg prn, Lipitor 40 mg daily, diprolene cream, krill oil 500 mg daily, hydrodiuril 12.5 mg daily, plaquenil 200 mg daily, losartan 50 mg bid, nortriptyline 10 mg prn, vit d 5000 iu daily, prilosec 20 mg daily  Allergies: codeine  Medication Adjustment per Dr Laural Golden: asa 2 days  Procedure date & time: 11/16/19

## 2019-10-13 NOTE — Telephone Encounter (Signed)
Patient needs Plenvu (copay card) ° °

## 2019-10-20 DIAGNOSIS — R7309 Other abnormal glucose: Secondary | ICD-10-CM | POA: Diagnosis not present

## 2019-10-20 DIAGNOSIS — E663 Overweight: Secondary | ICD-10-CM | POA: Diagnosis not present

## 2019-10-20 DIAGNOSIS — R1013 Epigastric pain: Secondary | ICD-10-CM | POA: Diagnosis not present

## 2019-10-20 DIAGNOSIS — M329 Systemic lupus erythematosus, unspecified: Secondary | ICD-10-CM | POA: Diagnosis not present

## 2019-10-20 DIAGNOSIS — R002 Palpitations: Secondary | ICD-10-CM | POA: Diagnosis not present

## 2019-10-20 DIAGNOSIS — N400 Enlarged prostate without lower urinary tract symptoms: Secondary | ICD-10-CM | POA: Diagnosis not present

## 2019-10-20 DIAGNOSIS — I456 Pre-excitation syndrome: Secondary | ICD-10-CM | POA: Diagnosis not present

## 2019-10-20 DIAGNOSIS — Z6825 Body mass index (BMI) 25.0-25.9, adult: Secondary | ICD-10-CM | POA: Diagnosis not present

## 2019-10-20 DIAGNOSIS — I10 Essential (primary) hypertension: Secondary | ICD-10-CM | POA: Diagnosis not present

## 2019-10-20 DIAGNOSIS — K219 Gastro-esophageal reflux disease without esophagitis: Secondary | ICD-10-CM | POA: Diagnosis not present

## 2019-10-20 DIAGNOSIS — R109 Unspecified abdominal pain: Secondary | ICD-10-CM | POA: Diagnosis not present

## 2019-11-03 DIAGNOSIS — R002 Palpitations: Secondary | ICD-10-CM | POA: Diagnosis not present

## 2019-11-03 DIAGNOSIS — B9681 Helicobacter pylori [H. pylori] as the cause of diseases classified elsewhere: Secondary | ICD-10-CM | POA: Diagnosis not present

## 2019-11-03 DIAGNOSIS — I10 Essential (primary) hypertension: Secondary | ICD-10-CM | POA: Diagnosis not present

## 2019-11-03 DIAGNOSIS — Z6825 Body mass index (BMI) 25.0-25.9, adult: Secondary | ICD-10-CM | POA: Diagnosis not present

## 2019-11-03 DIAGNOSIS — R7309 Other abnormal glucose: Secondary | ICD-10-CM | POA: Diagnosis not present

## 2019-11-03 DIAGNOSIS — K219 Gastro-esophageal reflux disease without esophagitis: Secondary | ICD-10-CM | POA: Diagnosis not present

## 2019-11-14 ENCOUNTER — Other Ambulatory Visit (HOSPITAL_COMMUNITY): Payer: Medicare HMO

## 2019-11-16 ENCOUNTER — Encounter (HOSPITAL_COMMUNITY): Admission: RE | Payer: Self-pay | Source: Home / Self Care

## 2019-11-16 ENCOUNTER — Ambulatory Visit (HOSPITAL_COMMUNITY): Admission: RE | Admit: 2019-11-16 | Payer: Medicare HMO | Source: Home / Self Care | Admitting: Internal Medicine

## 2019-11-16 SURGERY — COLONOSCOPY
Anesthesia: Moderate Sedation

## 2019-11-23 ENCOUNTER — Encounter (INDEPENDENT_AMBULATORY_CARE_PROVIDER_SITE_OTHER): Payer: Self-pay | Admitting: Gastroenterology

## 2019-11-23 ENCOUNTER — Encounter (INDEPENDENT_AMBULATORY_CARE_PROVIDER_SITE_OTHER): Payer: Self-pay | Admitting: *Deleted

## 2019-11-23 ENCOUNTER — Ambulatory Visit (INDEPENDENT_AMBULATORY_CARE_PROVIDER_SITE_OTHER): Payer: Medicare HMO | Admitting: Gastroenterology

## 2019-11-23 ENCOUNTER — Other Ambulatory Visit: Payer: Self-pay

## 2019-11-23 ENCOUNTER — Other Ambulatory Visit (INDEPENDENT_AMBULATORY_CARE_PROVIDER_SITE_OTHER): Payer: Self-pay | Admitting: *Deleted

## 2019-11-23 DIAGNOSIS — R109 Unspecified abdominal pain: Secondary | ICD-10-CM

## 2019-11-23 DIAGNOSIS — Z86010 Personal history of colonic polyps: Secondary | ICD-10-CM | POA: Diagnosis not present

## 2019-11-23 DIAGNOSIS — Z8619 Personal history of other infectious and parasitic diseases: Secondary | ICD-10-CM | POA: Diagnosis not present

## 2019-11-23 MED ORDER — HYOSCYAMINE SULFATE 0.125 MG SL SUBL: 0.1250 mg | SUBLINGUAL_TABLET | Freq: Four times a day (QID) | SUBLINGUAL | 0 refills | Status: DC | PRN

## 2019-11-23 NOTE — Patient Instructions (Signed)
Schedule EGD with gastric biopsies Schedule colonoscopy

## 2019-11-23 NOTE — Progress Notes (Signed)
Peter Flores, M.D. Gastroenterology & Hepatology [facility] [address] [place], [place] [address]  [address] [place] [address]  Referring MD: Sharilyn Sites  I will communicate my assessment and recommendations to the referring MD via EMR. "Note: Occasional unusual wording and randomly placed punctuation marks may result from the use of speech recognition technology to transcribe this document"  Chief Complaint: Abdominal pain  History of Present Illness: Peter Flores is a 64 y.o. male with past medical history of cutaneous lupus, IBS, GERD, anxiety, ?Wolff-Parkinson-White syndrome, history of melanoma, history of colon polyps (tubulovillous adenoma), who presents for evaluation of chest pain and abdominal pain.  Patient reports that in March 2021 he had an episode of severe acute L sided abdominal pain, worse in his left upper quadrant that radiated to his left side of the chest, described as a sharp pain. Due to the symptoms, the patient decided to go to the ER in [facility]  Troponins were checked which were within normal limits, CBC was unremarkable and BMP showed a glucose of 119 without any other ulcerations.  EKG did not show any major abnormalities.  Patient was discharged with an order for sucralfate 1 g every 6 hours.  He reports that after taking the medication the symptoms improved during the subsequent days. He reported feeling well until a month ago when he presented new onset right sided abdominal pain, worse in the right upper quadrant similar but not as severe as his initial presentation.  He took more of the Carafate pills he had at home and this has helped improve the pain so far.  He is still taking the Carafate at this moment. The patient otherwise denies having any nausea, vomiting, fever, chills, hematochezia, melena, hematemesis, abdominal distention, diarrhea, jaundice, pruritus  or weight changes.    Given the symptoms, the patient was evaluated by his PCP who did "blood test for H. pylori "which came back positive. He was diagnosed with H. Pylori 4 weeks ago through a blood test. Patient reports that he was given Bioxan for his H. Pylori which led to "skipping heartbeat", he was then switched to tetraclycine, metronidazole, amoxicillin and omeprazole which he finished a couple of days ago. Has not checked his H. Pylori status yet.  Patient has been on plaquenil for the last year.  Last colonoscopy was performed in 11/11/2010, no polyps were seen.  Last EGD performed same day, found to have hiatal hernia but no other findings.  Pathology results from esophageal biopsies showed minimally inflamed squamous mucosa but no presence of eosinophilic esophagitis or intestinal metaplasia.  Most recent available labs from 07/22/2019 showed white blood cell count 4.5, hemoglobin 14.4, platelets 222, BUN 15, creatinine 1.1, sodium 139, potassium 3.8, total bilirubin 0.6, alkaline phosphatase 97, albumin 4.6, AST 26, ALT 40, TSH 1.1, hemoglobin A1c 6.0. Most recent available imaging is liver US 2019 showing fatty liver.  FHx: neg for any gastrointestinal/liver disease, mother lung cancer, brother had colon polyps Social: neg smoking, alcohol or illicit drug use Surgical: non contributory  Past Medical History: Past Medical History:  Diagnosis Date  . Anxiety   . Arthritis   . Esophageal reflux 05/15/1999  . Family hx colonic polyps   . Hemorrhoids   . Hiatal hernia 05/15/1999  . IBS (irritable bowel syndrome)   . 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 Rectal fissure 04/30/1999  . Wolff-Parkinson-White (WPW) syndrome  Past Surgical History: Past Surgical History:  Procedure Laterality Date  . BACK SURGERY     x 2   . COLONOSCOPY    . KNEE SURGERY     Bilateral   . POLYPECTOMY      Family History: Family History  Problem Relation  Age of Onset  . Colon polyps Brother   . Coronary artery disease Brother 36       Stent  . Heart disease Brother   . Colon cancer Neg Hx     Social History: Social History   Tobacco Use  Smoking Status Former Smoker  . Quit date: 08/05/1981  . Years since quitting: 38.3  Smokeless Tobacco Never Used   Social History   Substance and Sexual Activity  Alcohol Use No   Social History   Substance and Sexual Activity  Drug Use No    Allergies: Allergies  Allergen Reactions  . Lisinopril Other (See Comments) and Rash    Hypotension, bradycardia, near syncope, full body rash Hypotension, bradycardia, near syncope, full body rash   . Vicodin [Hydrocodone-Acetaminophen] Shortness Of Breath  . Amlodipine Other (See Comments)    Headaches/nausea/hotflashes  . Biaxin [Clarithromycin] Other (See Comments)    Heart Palpatations    Medications: Current Outpatient Medications  Medication Sig Dispense Refill  . acetaminophen (TYLENOL) 500 MG tablet Take 500-1,000 mg by mouth every 6 (six) hours as needed (for pain.).    Marland Kitchen aspirin EC 81 MG tablet Take 81 mg by mouth daily. Swallow whole.    Marland Kitchen atorvastatin (LIPITOR) 40 MG tablet Take 40 mg by mouth daily.    . Cholecalciferol (VITAMIN D PO) Take 5,000 Units by mouth daily.     . hydrochlorothiazide (MICROZIDE) 12.5 MG capsule Take 12.5 mg by mouth daily.    . hydroxychloroquine (PLAQUENIL) 200 MG tablet Take 200 mg by mouth 2 (two) times daily.    Javier Docker Oil 500 MG CAPS Take 500 mg by mouth daily.    Marland Kitchen losartan (COZAAR) 50 MG tablet Take 100 mg by mouth daily.    . nortriptyline (PAMELOR) 10 MG capsule Take 10 mg by mouth at bedtime as needed for sleep.    Marland Kitchen omeprazole (PRILOSEC) 20 MG capsule Take 20 mg by mouth daily.    . propafenone (RYTHMOL) 225 MG tablet Take 1 tablet (225 mg total) by mouth 2 (two) times daily. (Patient not taking: Reported on 11/02/2019) 60 tablet 11  . sucralfate (CARAFATE) 1 g tablet Take 1 g by mouth in  the morning and at bedtime.     No current facility-administered medications for this visit.    Review of Systems: GENERAL: negative for malaise, significant weight loss, night sweats and fever HEENT: No changes in hearing or vision, no nose bleeds or other nasal problems. No trouble swallowing NECK: Negative for lumps, goiter, pain and significant neck swelling RESPIRATORY: Negative for cough, wheezing and shortness of breath CARDIOVASCULAR: Negative for chest pain, leg swelling, palpitations, orthopnea GI: SEE HPI MUSCULOSKELETAL: Negative for joint pain or swelling, back pain, and muscle pain. SKIN: Negative for lesions, rash, and itching. PSYCH: Negative for sleep disturbance, mood disorder and recent psychosocial stressors. HEMATOLOGY Negative for prolonged bleeding, bruising easily, and swollen nodes. ENDOCRINE: Negative for cold or heat intolerance, polyuria, polydipsia and goiter. NEURO: negative for lightheadedness, dizziness, tremor, gait imbalance, syncope and seizures. The remainder of the review of systems is noncontributory.   Physical Exam: BP 139/77 (BP Location: Right Arm, Patient Position: Sitting, Cuff Size: Large)  Pulse 75   Temp 97.9 F (36.6 C) (Oral)   Ht 6\' 2"  (1.88 m)   Wt 199 lb 14.4 oz (90.7 kg)   BMI 25.67 kg/m  GENERAL: The patient is AO x3, in no acute distress. HEENT: Head is normocephalic and atraumatic. EOMI are intact. Mouth is well hydrated and without lesions. NECK: Supple. No masses LUNGS: Clear to auscultation. No presence of rhonchi/wheezing/rales. Adequate chest expansion HEART: RRR, normal s1 and s2. ABDOMEN: Soft, nontender, no guarding, no peritoneal signs, and nondistended. BS +. No masses. EXTREMITIES: Without any cyanosis, clubbing, rash, lesions or edema. NEUROLOGIC: AOx3, no focal motor deficit. SKIN: no jaundice, no rashes   Imaging/Labs: as above  I personally reviewed and interpreted the available labs, imaging and  endoscopic files.  Impression and Plan: Peter Flores is a 64 y.o. male with past medical history of cutaneous lupus, IBS, GERD, anxiety, ?Wolff-Parkinson-White syndrome, history of melanoma, history of colon polyps (tubulovillous adenoma), who presents for evaluation of chest pain and abdominal pain.  The patient presented to episodes of abdominal pain of unclear origin which could be related to bowel hypersensitivity/irritable bowel syndrome given the nature of the symptoms without any red flag signs, all 4 lupus enteritis could be a possibility but less likely given the fact lupus has mainly affected his skin.  However, we will investigate for his abdominal pain with an EGD with gastric biopsies to determine if his H. pylori infection has cleared.  Also, we will prescribe Levsin as antispasmodic to improve his symptoms for now, he can stop taking Carafate.  Finally, the patient will be ordered a colonoscopy as he had a history of tubulovillous adenoma and he is due for surveillance.  The patient understood and agreed.  -Schedule EGD with gastric biopsies -Schedule colonoscopy -Levsin PRN for abdominal pain  All questions were answered.      Harvel Quale, MD

## 2019-12-06 NOTE — Patient Instructions (Signed)
Peter Flores  12/06/2019     @PREFPERIOPPHARMACY @   Your procedure is scheduled on  12/09/2019   Report to [facility] at  [place].  Call this number if you have problems the morning of surgery:  [phone]   Remember:  Follow the diet and prep instructions given to you by Dr Colman Cater office.                    Take these medicines the morning of surgery with A SIP OF WATER  prilosec.    Do not wear jewelry, make-up or nail polish.  Do not wear lotions, powders, or perfume. Please wear deodorant and brush your teeth.  Do not shave 48 hours prior to surgery.  Men may shave face and neck.  Do not bring valuables to the hospital.  [facility] is not responsible for any belongings or valuables.  Contacts, dentures or bridgework may not be worn into surgery.  Leave your suitcase in the car.  After surgery it may be brought to your room.  For patients admitted to the hospital, discharge time will be determined by your treatment team.  Patients discharged the day of surgery will not be allowed to drive home.   Name and phone number of your driver:   family Special instructions:  DO NOT smoke the morning of your procedure.  Please read over the following fact sheets that you were given. Anesthesia Post-op Instructions and Care and Recovery After Surgery       Upper Endoscopy, Adult, Care After This sheet gives you information about how to care for yourself after your procedure. Your health care provider may also give you more specific instructions. If you have problems or questions, contact your health care provider. What can I expect after the procedure? After the procedure, it is common to have:  A sore throat.  Mild stomach pain or discomfort.  Bloating.  Nausea. Follow these instructions at home:   Follow instructions from your health care provider about what to eat or drink after your procedure.  Return to your normal activities as told by your  health care provider. Ask your health care provider what activities are safe for you.  Take over-the-counter and prescription medicines only as told by your health care provider.  Do not drive for 24 hours if you were given a sedative during your procedure.  Keep all follow-up visits as told by your health care provider. This is important. Contact a health care provider if you have:  A sore throat that lasts longer than one day.  Trouble swallowing. Get help right away if:  You vomit blood or your vomit looks like coffee grounds.  You have: ? A fever. ? Bloody, black, or tarry stools. ? A severe sore throat or you cannot swallow. ? Difficulty breathing. ? Severe pain in your chest or abdomen. Summary  After the procedure, it is common to have a sore throat, mild stomach discomfort, bloating, and nausea.  Do not drive for 24 hours if you were given a sedative during the procedure.  Follow instructions from your health care provider about what to eat or drink after your procedure.  Return to your normal activities as told by your health care provider. This information is not intended to replace advice given to you by your health care provider. Make sure you discuss any questions you have with your health care provider. Document Revised: 10/27/2017 Document Reviewed:  10/05/2017 Elsevier Patient Education  [place].  Colonoscopy, Adult, Care After This sheet gives you information about how to care for yourself after your procedure. Your health care provider may also give you more specific instructions. If you have problems or questions, contact your health care provider. What can I expect after the procedure? After the procedure, it is common to have:  A small amount of blood in your stool for 24 hours after the procedure.  Some gas.  Mild cramping or bloating of your abdomen. Follow these instructions at home: Eating and drinking   Drink enough fluid to keep  your urine pale yellow.  Follow instructions from your health care provider about eating or drinking restrictions.  Resume your normal diet as instructed by your health care provider. Avoid heavy or fried foods that are hard to digest. Activity  Rest as told by your health care provider.  Avoid sitting for a long time without moving. Get up to take short walks every 1-2 hours. This is important to improve blood flow and breathing. Ask for help if you feel weak or unsteady.  Return to your normal activities as told by your health care provider. Ask your health care provider what activities are safe for you. Managing cramping and bloating   Try walking around when you have cramps or feel bloated.  Apply heat to your abdomen as told by your health care provider. Use the heat source that your health care provider recommends, such as a moist heat pack or a heating pad. ? Place a towel between your skin and the heat source. ? Leave the heat on for 20-30 minutes. ? Remove the heat if your skin turns bright red. This is especially important if you are unable to feel pain, heat, or cold. You may have a greater risk of getting burned. General instructions  For the first 24 hours after the procedure: ? Do not drive or use machinery. ? Do not sign important documents. ? Do not drink alcohol. ? Do your regular daily activities at a slower pace than normal. ? Eat soft foods that are easy to digest.  Take over-the-counter and prescription medicines only as told by your health care provider.  Keep all follow-up visits as told by your health care provider. This is important. Contact a health care provider if:  You have blood in your stool 2-3 days after the procedure. Get help right away if you have:  More than a small spotting of blood in your stool.  Large blood clots in your stool.  Swelling of your abdomen.  Nausea or vomiting.  A fever.  Increasing pain in your abdomen that is not  relieved with medicine. Summary  After the procedure, it is common to have a small amount of blood in your stool. You may also have mild cramping and bloating of your abdomen.  For the first 24 hours after the procedure, do not drive or use machinery, sign important documents, or drink alcohol.  Get help right away if you have a lot of blood in your stool, nausea or vomiting, a fever, or increased pain in your abdomen. This information is not intended to replace advice given to you by your health care provider. Make sure you discuss any questions you have with your health care provider. Document Revised: 11/29/2018 Document Reviewed: 11/29/2018 Elsevier Patient Education  [place] After These instructions provide you with information about caring for yourself after your procedure. Your  health care provider may also give you more specific instructions. Your treatment has been planned according to current medical practices, but problems sometimes occur. Call your health care provider if you have any problems or questions after your procedure. What can I expect after the procedure? After your procedure, you may:  Feel sleepy for several hours.  Feel clumsy and have poor balance for several hours.  Feel forgetful about what happened after the procedure.  Have poor judgment for several hours.  Feel nauseous or vomit.  Have a sore throat if you had a breathing tube during the procedure. Follow these instructions at home: For at least 24 hours after the procedure:      Have a responsible adult stay with you. It is important to have someone help care for you until you are awake and alert.  Rest as needed.  Do not: ? Participate in activities in which you could fall or become injured. ? Drive. ? Use heavy machinery. ? Drink alcohol. ? Take sleeping pills or medicines that cause drowsiness. ? Make important decisions or sign legal  documents. ? Take care of children on your own. Eating and drinking  Follow the diet that is recommended by your health care provider.  If you vomit, drink water, juice, or soup when you can drink without vomiting.  Make sure you have little or no nausea before eating solid foods. General instructions  Take over-the-counter and prescription medicines only as told by your health care provider.  If you have sleep apnea, surgery and certain medicines can increase your risk for breathing problems. Follow instructions from your health care provider about wearing your sleep device: ? Anytime you are sleeping, including during daytime naps. ? While taking prescription pain medicines, sleeping medicines, or medicines that make you drowsy.  If you smoke, do not smoke without supervision.  Keep all follow-up visits as told by your health care provider. This is important. Contact a health care provider if:  You keep feeling nauseous or you keep vomiting.  You feel light-headed.  You develop a rash.  You have a fever. Get help right away if:  You have trouble breathing. Summary  For several hours after your procedure, you may feel sleepy and have poor judgment.  Have a responsible adult stay with you for at least 24 hours or until you are awake and alert. This information is not intended to replace advice given to you by your health care provider. Make sure you discuss any questions you have with your health care provider. Document Revised: 08/03/2017 Document Reviewed: 08/26/2015 Elsevier Patient Education  [place].

## 2019-12-07 ENCOUNTER — Other Ambulatory Visit: Payer: Self-pay

## 2019-12-07 ENCOUNTER — Encounter (HOSPITAL_COMMUNITY): Payer: Self-pay

## 2019-12-07 ENCOUNTER — Encounter (HOSPITAL_COMMUNITY)
Admission: RE | Admit: 2019-12-07 | Discharge: 2019-12-07 | Disposition: A | Discharge status: Home / Self Care | Payer: Medicare HMO | Source: Ambulatory Visit | Attending: Gastroenterology | Admitting: Gastroenterology

## 2019-12-07 ENCOUNTER — Telehealth (INDEPENDENT_AMBULATORY_CARE_PROVIDER_SITE_OTHER): Payer: Self-pay | Admitting: Gastroenterology

## 2019-12-07 ENCOUNTER — Other Ambulatory Visit (HOSPITAL_COMMUNITY)
Admission: RE | Admit: 2019-12-07 | Discharge: 2019-12-07 | Disposition: A | Discharge status: Home / Self Care | Payer: Medicare HMO | Source: Ambulatory Visit | Attending: Gastroenterology | Admitting: Gastroenterology

## 2019-12-07 DIAGNOSIS — Z20822 Contact with and (suspected) exposure to covid-19: Secondary | ICD-10-CM | POA: Insufficient documentation

## 2019-12-07 DIAGNOSIS — Z01812 Encounter for preprocedural laboratory examination: Secondary | ICD-10-CM | POA: Insufficient documentation

## 2019-12-07 LAB — BASIC METABOLIC PANEL
Anion gap: 9 (ref 5–15)
BUN: 31 mg/dL — ABNORMAL HIGH (ref 8–23)
CO2: 24 mmol/L (ref 22–32)
Calcium: 9.1 mg/dL (ref 8.9–10.3)
Chloride: 105 mmol/L (ref 98–111)
Creatinine, Ser: 1.33 mg/dL — ABNORMAL HIGH (ref 0.61–1.24)
GFR calc Af Amer: >60 mL/min (ref >60)
GFR calc non Af Amer: 56 mL/min — ABNORMAL LOW (ref >60)
Glucose, Bld: 141 mg/dL — ABNORMAL HIGH (ref 70–99)
Potassium: 3.3 mmol/L — ABNORMAL LOW (ref 3.5–5.1)
Sodium: 138 mmol/L (ref 135–145)

## 2019-12-07 LAB — SARS CORONAVIRUS 2 (TAT 6-24 HRS): SARS Coronavirus 2: NEGATIVE

## 2019-12-07 MED ORDER — POTASSIUM CHLORIDE CRYS ER 20 MEQ PO TBCR: 40.0000 meq | EXTENDED_RELEASE_TABLET | Freq: Once | ORAL | 0 refills | Status: AC

## 2019-12-07 NOTE — Telephone Encounter (Signed)
I spoke to the patient's daughter regarding the patient's most recent BMP which showed slightly decreased potassium of 3.3 with mild increase in his creatinine of 1.3.  I will send a pack of oral potassium to his pharmacy so he can take it tomorrow.  The daughter understood and agreed.  Harvel Quale, MD Gastroenterology and Hepatology [facility]

## 2019-12-09 ENCOUNTER — Encounter (HOSPITAL_COMMUNITY)
Admission: RE | Disposition: A | Discharge status: Home / Self Care | Payer: Self-pay | Source: Home / Self Care | Attending: Gastroenterology

## 2019-12-09 ENCOUNTER — Encounter (HOSPITAL_COMMUNITY): Payer: Self-pay | Admitting: Gastroenterology

## 2019-12-09 ENCOUNTER — Ambulatory Visit (HOSPITAL_COMMUNITY): Payer: Medicare HMO | Admitting: Anesthesiology

## 2019-12-09 ENCOUNTER — Ambulatory Visit (HOSPITAL_COMMUNITY)
Admission: RE | Admit: 2019-12-09 | Discharge: 2019-12-09 | Disposition: A | Discharge status: Home / Self Care | Payer: Medicare HMO | Attending: Gastroenterology | Admitting: Gastroenterology

## 2019-12-09 DIAGNOSIS — K3189 Other diseases of stomach and duodenum: Secondary | ICD-10-CM | POA: Diagnosis not present

## 2019-12-09 DIAGNOSIS — Z86010 Personal history of colonic polyps: Secondary | ICD-10-CM | POA: Diagnosis not present

## 2019-12-09 DIAGNOSIS — Z85820 Personal history of malignant melanoma of skin: Secondary | ICD-10-CM | POA: Insufficient documentation

## 2019-12-09 DIAGNOSIS — K2970 Gastritis, unspecified, without bleeding: Secondary | ICD-10-CM | POA: Diagnosis not present

## 2019-12-09 DIAGNOSIS — K5730 Diverticulosis of large intestine without perforation or abscess without bleeding: Secondary | ICD-10-CM | POA: Insufficient documentation

## 2019-12-09 DIAGNOSIS — M1990 Unspecified osteoarthritis, unspecified site: Secondary | ICD-10-CM | POA: Insufficient documentation

## 2019-12-09 DIAGNOSIS — Z8619 Personal history of other infectious and parasitic diseases: Secondary | ICD-10-CM | POA: Diagnosis not present

## 2019-12-09 DIAGNOSIS — D125 Benign neoplasm of sigmoid colon: Secondary | ICD-10-CM | POA: Diagnosis not present

## 2019-12-09 DIAGNOSIS — Z87891 Personal history of nicotine dependence: Secondary | ICD-10-CM | POA: Diagnosis not present

## 2019-12-09 DIAGNOSIS — Z09 Encounter for follow-up examination after completed treatment for conditions other than malignant neoplasm: Secondary | ICD-10-CM | POA: Diagnosis not present

## 2019-12-09 DIAGNOSIS — R1012 Left upper quadrant pain: Secondary | ICD-10-CM | POA: Insufficient documentation

## 2019-12-09 DIAGNOSIS — K2950 Unspecified chronic gastritis without bleeding: Secondary | ICD-10-CM | POA: Diagnosis not present

## 2019-12-09 DIAGNOSIS — I10 Essential (primary) hypertension: Secondary | ICD-10-CM | POA: Diagnosis not present

## 2019-12-09 DIAGNOSIS — K635 Polyp of colon: Secondary | ICD-10-CM | POA: Diagnosis not present

## 2019-12-09 DIAGNOSIS — Z1211 Encounter for screening for malignant neoplasm of colon: Secondary | ICD-10-CM | POA: Insufficient documentation

## 2019-12-09 HISTORY — PX: COLONOSCOPY WITH PROPOFOL: SHX5780

## 2019-12-09 HISTORY — PX: BIOPSY: SHX5522

## 2019-12-09 HISTORY — PX: ESOPHAGOGASTRODUODENOSCOPY (EGD) WITH PROPOFOL: SHX5813

## 2019-12-09 HISTORY — PX: POLYPECTOMY: SHX5525

## 2019-12-09 SURGERY — COLONOSCOPY WITH PROPOFOL
Anesthesia: General

## 2019-12-09 MED ORDER — LIDOCAINE VISCOUS HCL 2 % MT SOLN
15.0000 mL | Freq: Once | OROMUCOSAL | Status: AC
  Administered 2019-12-09: 15 mL via OROMUCOSAL

## 2019-12-09 MED ORDER — PROPOFOL 500 MG/50ML IV EMUL
INTRAVENOUS | Status: DC | PRN
  Administered 2019-12-09: 150 ug/kg/min via INTRAVENOUS

## 2019-12-09 MED ORDER — STERILE WATER FOR IRRIGATION IR SOLN
Status: DC | PRN
  Administered 2019-12-09: 1.5 mL

## 2019-12-09 MED ORDER — LIDOCAINE VISCOUS HCL 2 % MT SOLN
OROMUCOSAL | Status: AC
  Filled 2019-12-09: qty 15

## 2019-12-09 MED ORDER — PROPOFOL 10 MG/ML IV BOLUS
INTRAVENOUS | Status: AC
  Filled 2019-12-09: qty 20

## 2019-12-09 MED ORDER — LACTATED RINGERS IV SOLN: INTRAVENOUS | Status: DC | PRN

## 2019-12-09 MED ORDER — MIDAZOLAM HCL 5 MG/5ML IJ SOLN
INTRAMUSCULAR | Status: DC | PRN
  Administered 2019-12-09: 2 mg via INTRAVENOUS

## 2019-12-09 MED ORDER — MIDAZOLAM HCL 2 MG/2ML IJ SOLN
INTRAMUSCULAR | Status: AC
  Filled 2019-12-09: qty 2

## 2019-12-09 MED ORDER — LACTATED RINGERS IV SOLN: Freq: Once | INTRAVENOUS | Status: AC

## 2019-12-09 MED ORDER — GLYCOPYRROLATE 0.2 MG/ML IJ SOLN
0.2000 mg | Freq: Once | INTRAMUSCULAR | Status: AC
  Administered 2019-12-09: 0.2 mg via INTRAVENOUS
  Filled 2019-12-09: qty 1

## 2019-12-09 NOTE — Op Note (Signed)
[facility] Patient Name: Peter Flores Procedure Date: 12/09/2019 7:09 AM MRN: [id number] Date of Birth: August 13, 1955 Attending MD: [name] ,  CSN: [id number] Age: 64 Admit Type: Outpatient Procedure:                Upper GI endoscopy Indications:              Abdominal pain in the left upper quadrant, history                            of H. pylori Providers:                [name], Charlsie Quest. Theda Sers RN, RN, Crystal                            Page, Randa Spike, Technician Referring MD:              Medicines:                Monitored Anesthesia Care Complications:            No immediate complications. Estimated Blood Loss:     Estimated blood loss: none. Procedure:                Pre-Anesthesia Assessment:                           - Prior to the procedure, a History and Physical                            was performed, and patient medications, allergies                            and sensitivities were reviewed. The patient's                            tolerance of previous anesthesia was reviewed.                           - The risks and benefits of the procedure and the                            sedation options and risks were discussed with the                            patient. All questions were answered and informed                            consent was obtained.                           - ASA Grade Assessment: III - A patient with severe                            systemic disease.                           After obtaining informed consent, the  endoscope was                            passed under direct vision. Throughout the                            procedure, the patient's blood pressure, pulse, and                            oxygen saturations were monitored continuously. The                            GIF-H190 ([id number]) scope was introduced through the                            mouth, and advanced to the second part of duodenum.                             The upper GI endoscopy was accomplished without                            difficulty. The patient tolerated the procedure                            well. Scope In: 7:38:29 AM Scope Out: 7:44:41 AM Total Procedure Duration: 0 hours 6 minutes 12 seconds  Findings:      The Z-line was regular and was found 41 cm from the incisors.      The examined esophagus was normal.      Diffuse inflammation characterized by erythema was found in the entire       examined stomach. Biopsies from body and antrum were taken with a cold       forceps for Helicobacter pylori testing.      Diffuse atrophic mucosa was found in the gastric antrum. Imaging was       performed using white light and narrow band imaging to visualize the       mucosa.      The examined duodenum was normal. Impression:               - Z-line regular, 41 cm from the incisors.                           - Normal esophagus.                           - Gastritis. Biopsied.                           - Gastric mucosal atrophy.                           - Normal examined duodenum. Moderate Sedation:      Per Anesthesia Care Recommendation:           - Discharge patient to home (ambulatory).                           -  Resume previous diet.                           - Await pathology results. Procedure Code(s):        --- Professional ---                           [phone], GC, Esophagogastroduodenoscopy, flexible,                            transoral; with biopsy, single or multiple Diagnosis Code(s):        --- Professional ---                           K29.70, Gastritis, unspecified, without bleeding                           K31.89, Other diseases of stomach and duodenum                           R10.12, Left upper quadrant pain CPT copyright 2019 American Medical Association. All rights reserved. The codes documented in this report are preliminary and upon coder review may  be revised to meet current compliance  requirements. [name], MD [name],  12/09/2019 8:24:46 AM This report has been signed electronically. Number of Addenda: 0

## 2019-12-09 NOTE — Addendum Note (Signed)
Addendum  created 12/09/19 0918 by Jonna Munro, CRNA   Charge Capture section accepted

## 2019-12-09 NOTE — Op Note (Signed)
[facility] Patient Name: Peter Flores Procedure Date: 12/09/2019 7:45 AM MRN: [id number] Date of Birth: Nov 09, 1955 Attending MD: [name] ,  CSN: [id number] Age: 64 Admit Type: Outpatient Procedure:                Colonoscopy Indications:              High risk colon cancer surveillance: Personal                            history of adenoma with villous component Providers:                [name], Charlsie Quest. Theda Sers RN, RN, Crystal                            Page, Randa Spike, Technician Referring MD:              Medicines:                Monitored Anesthesia Care Complications:            No immediate complications. Estimated Blood Loss:     Estimated blood loss: none. Procedure:                Pre-Anesthesia Assessment:                           - Prior to the procedure, a History and Physical                            was performed, and patient medications, allergies                            and sensitivities were reviewed. The patient's                            tolerance of previous anesthesia was reviewed.                           - The risks and benefits of the procedure and the                            sedation options and risks were discussed with the                            patient. All questions were answered and informed                            consent was obtained.                           - ASA Grade Assessment: III - A patient with severe                            systemic disease.                           After obtaining informed consent, the colonoscope  was passed under direct vision. Throughout the                            procedure, the patient's blood pressure, pulse, and                            oxygen saturations were monitored continuously. The                            PCF-H190DL ([id number]) scope was introduced through                            the anus and advanced to the the cecum,  identified                            by appendiceal orifice and ileocecal valve. The                            colonoscopy was performed without difficulty. The                            patient tolerated the procedure well. The quality                            of the bowel preparation was excellent. Scope                            withdrawal time was 12 minutes. Scope In: 7:49:08 AM Scope Out: 8:06:30 AM Scope Withdrawal Time: 0 hours 12 minutes 55 seconds  Total Procedure Duration: 0 hours 17 minutes 22 seconds  Findings:      The perianal and digital rectal examinations were normal.      A single large-mouthed diverticulum was found in the ascending colon.      A 3 mm polyp was found in the sigmoid colon. The polyp was sessile. The       polyp was removed with a cold biopsy forceps. Resection and retrieval       were complete.      The retroflexed view of the distal rectum and anal verge was normal and       showed no anal or rectal abnormalities. Impression:               - Diverticulosis in the ascending colon.                           - One 3 mm polyp in the sigmoid colon, removed with                            a cold biopsy forceps. Resected and retrieved. Moderate Sedation:      Per Anesthesia Care Recommendation:           - Discharge patient to home (ambulatory).                           - Resume previous diet.                           -  Await pathology results.                           - Repeat colonoscopy for surveillance based on                            pathology results. Procedure Code(s):        --- Professional ---                           [phone], GC, Colonoscopy, flexible; with biopsy,                            single or multiple Diagnosis Code(s):        --- Professional ---                           Z86.010, Personal history of colonic polyps                           K63.5, Polyp of colon                           K57.30, Diverticulosis of large  intestine without                            perforation or abscess without bleeding CPT copyright 2019 American Medical Association. All rights reserved. The codes documented in this report are preliminary and upon coder review may  be revised to meet current compliance requirements. [name], MD [name],  12/09/2019 8:29:14 AM This report has been signed electronically. Number of Addenda: 0

## 2019-12-09 NOTE — Discharge Instructions (Signed)
Colon Polyps  Polyps are tissue growths inside the body. Polyps can grow in many places, including the large intestine (colon). A polyp may be a round bump or a mushroom-shaped growth. You could have one polyp or several. Most colon polyps are noncancerous (benign). However, some colon polyps can become cancerous over time. Finding and removing the polyps early can help prevent this. What are the causes? The exact cause of colon polyps is not known. What increases the risk? You are more likely to develop this condition if you:  Have a family history of colon cancer or colon polyps.  Are older than 55 or older than 45 if you are African American.  Have inflammatory bowel disease, such as ulcerative colitis or Crohn's disease.  Have certain hereditary conditions, such as: ? Familial adenomatous polyposis. ? Lynch syndrome. ? Turcot syndrome. ? Peutz-Jeghers syndrome.  Are overweight.  Smoke cigarettes.  Do not get enough exercise.  Drink too much alcohol.  Eat a diet that is high in fat and red meat and low in fiber.  Had childhood cancer that was treated with abdominal radiation. What are the signs or symptoms? Most polyps do not cause symptoms. If you have symptoms, they may include:  Blood coming from your rectum when having a bowel movement.  Blood in your stool. The stool may look dark red or black.  Abdominal pain.  A change in bowel habits, such as constipation or diarrhea. How is this diagnosed? This condition is diagnosed with a colonoscopy. This is a procedure in which a lighted, flexible scope is inserted into the anus and then passed into the colon to examine the area. Polyps are sometimes found when a colonoscopy is done as part of routine cancer screening tests. How is this treated? Treatment for this condition involves removing any polyps that are found. Most polyps can be removed during a colonoscopy. Those polyps will then be tested for cancer. Additional  treatment may be needed depending on the results of testing. Follow these instructions at home: Lifestyle  Maintain a healthy weight, or lose weight if recommended by your health care provider.  Exercise every day or as told by your health care provider.  Do not use any products that contain nicotine or tobacco, such as cigarettes and e-cigarettes. If you need help quitting, ask your health care provider.  If you drink alcohol, limit how much you have: ? 0-1 drink a day for women. ? 0-2 drinks a day for men.  Be aware of how much alcohol is in your drink. In the [place] one drink equals one 12 oz bottle of beer (355 mL), one 5 oz glass of wine (148 mL), or one 1 oz shot of hard liquor (44 mL). Eating and drinking   Eat foods that are high in fiber, such as fruits, vegetables, and whole grains.  Eat foods that are high in calcium and vitamin D, such as milk, cheese, yogurt, eggs, liver, fish, and broccoli.  Limit foods that are high in fat, such as fried foods and desserts.  Limit the amount of red meat and processed meat you eat, such as hot dogs, sausage, bacon, and lunch meats. General instructions  Keep all follow-up visits as told by your health care provider. This is important. ? This includes having regularly scheduled colonoscopies. ? Talk to your health care provider about when you need a colonoscopy. Contact a health care provider if:  You have new or worsening bleeding during a bowel movement.  You  have new or increased blood in your stool.  You have a change in bowel habits.  You lose weight for no known reason. Summary  Polyps are tissue growths inside the body. Polyps can grow in many places, including the colon.  Most colon polyps are noncancerous (benign), but some can become cancerous over time.  This condition is diagnosed with a colonoscopy.  Treatment for this condition involves removing any polyps that are found. Most polyps can be removed during a  colonoscopy. This information is not intended to replace advice given to you by your health care provider. Make sure you discuss any questions you have with your health care provider.   Colonoscopy, Adult, Care After This sheet gives you information about how to care for yourself after your procedure. Your doctor may also give you more specific instructions. If you have problems or questions, call your doctor. What can I expect after the procedure? After the procedure, it is common to have:  A small amount of blood in your poop (stool) for 24 hours.  Some gas.  Mild cramping or bloating in your belly (abdomen). Follow these instructions at home: Eating and drinking   Drink enough fluid to keep your pee (urine) pale yellow.  Follow instructions from your doctor about what you cannot eat or drink.  Return to your normal diet as told by your doctor. Avoid heavy or fried foods that are hard to digest. Activity  Rest as told by your doctor.  Do not sit for a long time without moving. Get up to take short walks every 1-2 hours. This is important. Ask for help if you feel weak or unsteady.  Return to your normal activities as told by your doctor. Ask your doctor what activities are safe for you. To help cramping and bloating:   Try walking around.  Put heat on your belly as told by your doctor. Use the heat source that your doctor recommends, such as a moist heat pack or a heating pad. ? Put a towel between your skin and the heat source. ? Leave the heat on for 20-30 minutes. ? Remove the heat if your skin turns bright red. This is very important if you are unable to feel pain, heat, or cold. You may have a greater risk of getting burned. General instructions  For the first 24 hours after the procedure: ? Do not drive or use machinery. ? Do not sign important documents. ? Do not drink alcohol. ? Do your daily activities more slowly than normal. ? Eat foods that are soft and  easy to digest.  Take over-the-counter or prescription medicines only as told by your doctor.  Keep all follow-up visits as told by your doctor. This is important. Contact a doctor if:  You have blood in your poop 2-3 days after the procedure. Get help right away if:  You have more than a small amount of blood in your poop.  You see large clumps of tissue (blood clots) in your poop.  Your belly is swollen.  You feel like you may vomit (nauseous).  You vomit.  You have a fever.  You have belly pain that gets worse, and medicine does not help your pain. Summary  After the procedure, it is common to have a small amount of blood in your poop. You may also have mild cramping and bloating in your belly.  For the first 24 hours after the procedure, do not drive or use machinery, do not sign important  documents, and do not drink alcohol.  Get help right away if you have a lot of blood in your poop, feel like you may vomit, have a fever, or have more belly pain. This information is not intended to replace advice given to you by your health care provider. Make sure you discuss any questions you have with your health care provider. Document Revised: 11/29/2018 Document Reviewed: 11/29/2018 Elsevier Patient Education  [place] Revised: 08/20/2017 Document Reviewed: 08/20/2017 Elsevier Patient Education  2020 [place].   Upper Endoscopy, Adult, Care After This sheet gives you information about how to care for yourself after your procedure. Your health care provider may also give you more specific instructions. If you have problems or questions, contact your health care provider. What can I expect after the procedure? After the procedure, it is common to have:  A sore throat.  Mild stomach pain or discomfort.  Bloating.  Nausea. Follow these instructions at home:   Follow instructions from your health care provider about what to eat or drink after your  procedure.  Return to your normal activities as told by your health care provider. Ask your health care provider what activities are safe for you.  Take over-the-counter and prescription medicines only as told by your health care provider.  Do not drive for 24 hours if you were given a sedative during your procedure.  Keep all follow-up visits as told by your health care provider. This is important. Contact a health care provider if you have:  A sore throat that lasts longer than one day.  Trouble swallowing. Get help right away if:  You vomit blood or your vomit looks like coffee grounds.  You have: ? A fever. ? Bloody, black, or tarry stools. ? A severe sore throat or you cannot swallow. ? Difficulty breathing. ? Severe pain in your chest or abdomen. Summary  After the procedure, it is common to have a sore throat, mild stomach discomfort, bloating, and nausea.  Do not drive for 24 hours if you were given a sedative during the procedure.  Follow instructions from your health care provider about what to eat or drink after your procedure.  Return to your normal activities as told by your health care provider. This information is not intended to replace advice given to you by your health care provider. Make sure you discuss any questions you have with your health care provider. Document Revised: 10/27/2017 Document Reviewed: 10/05/2017 Elsevier Patient Education  2020 [place] After These instructions provide you with information about caring for yourself after your procedure. Your health care provider may also give you more specific instructions. Your treatment has been planned according to current medical practices, but problems sometimes occur. Call your health care provider if you have any problems or questions after your procedure. What can I expect after the procedure? After your procedure, you may:  Feel sleepy for several  hours.  Feel clumsy and have poor balance for several hours.  Feel forgetful about what happened after the procedure.  Have poor judgment for several hours.  Feel nauseous or vomit.  Have a sore throat if you had a breathing tube during the procedure. Follow these instructions at home: For at least 24 hours after the procedure:      Have a responsible adult stay with you. It is important to have someone help care for you until you are awake and alert.  Rest as needed.  Do not: ? Participate in activities in which you could fall or become injured. ? Drive. ? Use heavy machinery. ? Drink alcohol. ? Take sleeping pills or medicines that cause drowsiness. ? Make important decisions or sign legal documents. ? Take care of children on your own. Eating and drinking  Follow the diet that is recommended by your health care provider.  If you vomit, drink water, juice, or soup when you can drink without vomiting.  Make sure you have little or no nausea before eating solid foods. General instructions  Take over-the-counter and prescription medicines only as told by your health care provider.  If you have sleep apnea, surgery and certain medicines can increase your risk for breathing problems. Follow instructions from your health care provider about wearing your sleep device: ? Anytime you are sleeping, including during daytime naps. ? While taking prescription pain medicines, sleeping medicines, or medicines that make you drowsy.  If you smoke, do not smoke without supervision.  Keep all follow-up visits as told by your health care provider. This is important. Contact a health care provider if:  You keep feeling nauseous or you keep vomiting.  You feel light-headed.  You develop a rash.  You have a fever. Get help right away if:  You have trouble breathing. Summary  For several hours after your procedure, you may feel sleepy and have poor judgment.  Have a  responsible adult stay with you for at least 24 hours or until you are awake and alert. This information is not intended to replace advice given to you by your health care provider. Make sure you discuss any questions you have with your health care provider. Document Revised: 08/03/2017 Document Reviewed: 08/26/2015 Elsevier Patient Education  [place].

## 2019-12-09 NOTE — Anesthesia Postprocedure Evaluation (Signed)
Anesthesia Post Note  Patient: JONH MCQUEARY  Procedure(s) Performed: COLONOSCOPY WITH PROPOFOL (N/A ) ESOPHAGOGASTRODUODENOSCOPY (EGD) WITH PROPOFOL (N/A ) BIOPSY POLYPECTOMY  Patient location during evaluation: PACU Anesthesia Type: General Level of consciousness: awake, oriented, awake and alert and patient cooperative Pain management: satisfactory to patient Vital Signs Assessment: post-procedure vital signs reviewed and stable Respiratory status: spontaneous breathing, respiratory function stable and nonlabored ventilation Cardiovascular status: stable Postop Assessment: no apparent nausea or vomiting Anesthetic complications: no   No complications documented.   Last Vitals:  Vitals:   12/09/19 0638 12/09/19 0816  BP: (!) 137/79 (!) 90/59  Pulse: 56 66  Resp: 16 20  Temp: 36.4 C   SpO2: 98% 100%    Last Pain:  Vitals:   12/09/19 0732  TempSrc:   PainSc: 0-No pain                 Destin Kittler

## 2019-12-09 NOTE — Anesthesia Preprocedure Evaluation (Addendum)
Anesthesia Evaluation  Patient identified by MRN, date of birth, ID band Patient awake    Reviewed: Allergy & Precautions, NPO status , Patient's Chart, lab work & pertinent test results  History of Anesthesia Complications Negative for: history of anesthetic complications  Airway Mallampati: III  TM Distance: >3 FB Neck ROM: Full    Dental  (+) Chipped, Caps, Dental Advisory Given,    Pulmonary shortness of breath, former smoker,    Pulmonary exam normal breath sounds clear to auscultation       Cardiovascular Exercise Tolerance: Good hypertension, Pt. on medications Normal cardiovascular exam+ dysrhythmias (WPW Syndrome)  Rhythm:Regular Rate:Normal - Systolic murmurs, - Diastolic murmurs, - Friction Rub, - Carotid Bruit, - Peripheral Edema and - Systolic Click [id number] 38:45:36 [facility]-ED ROUTINE RECORD Normal sinus rhythm Right superior axis deviation Pulmonary disease pattern Incomplete right bundle branch block Right ventricular hypertrophy Abnormal ECG When compared with ECG of 16-Oct-2017 06:19, PREVIOUS ECG IS PRESENT   Neuro/Psych Anxiety negative neurological ROS     GI/Hepatic hiatal hernia, GERD  Medicated and Controlled,  Endo/Other  negative endocrine ROS  Renal/GU Renal InsufficiencyRenal disease  negative genitourinary   Musculoskeletal  (+) Arthritis , Osteoarthritis,    Abdominal   Peds negative pediatric ROS (+)  Hematology negative hematology ROS (+)   Anesthesia Other Findings Lupus   Reproductive/Obstetrics                           Anesthesia Physical Anesthesia Plan  ASA: II  Anesthesia Plan: General   Post-op Pain Management:    Induction: Intravenous  PONV Risk Score and Plan: 1 and TIVA and Treatment may vary due to age or medical condition  Airway Management Planned: Nasal Cannula, Natural Airway and Simple Face  Mask  Additional Equipment:   Intra-op Plan:   Post-operative Plan:   Informed Consent: I have reviewed the patients History and Physical, chart, labs and discussed the procedure including the risks, benefits and alternatives for the proposed anesthesia with the patient or authorized representative who has indicated his/her understanding and acceptance.     Dental advisory given  Plan Discussed with: CRNA and Surgeon  Anesthesia Plan Comments:        Anesthesia Quick Evaluation

## 2019-12-09 NOTE — Transfer of Care (Signed)
Immediate Anesthesia Transfer of Care Note  Patient: Peter Flores  Procedure(s) Performed: COLONOSCOPY WITH PROPOFOL (N/A ) ESOPHAGOGASTRODUODENOSCOPY (EGD) WITH PROPOFOL (N/A ) BIOPSY POLYPECTOMY  Patient Location: PACU  Anesthesia Type:General  Level of Consciousness: awake, alert , oriented and patient cooperative  Airway & Oxygen Therapy: Patient Spontanous Breathing  Post-op Assessment: Report given to RN, Post -op Vital signs reviewed and stable and Patient moving all extremities X 4  Post vital signs: Reviewed and stable  Last Vitals:  Vitals Value Taken Time  BP    Temp    Pulse 67 12/09/19 0814  Resp    SpO2 100 % 12/09/19 0814  Vitals shown include unvalidated device data.  Last Pain:  Vitals:   12/09/19 0732  TempSrc:   PainSc: 0-No pain         Complications: No complications documented.

## 2019-12-09 NOTE — H&P (Signed)
[name], M.D. Gastroenterology & Hepatology [place], [place] [address]                                          Patient Name: Peter Flores Account #: '@[id number]' @   MRN: [id number] Admission Date: 12/09/2019 Date of Evaluation:  12/09/2019 Time of Evaluation: 7:29 AM  Chief Complaint: Evaluation of abdominal pain and surveillance colonoscopy with EGD and colonoscopy  HPI: Briefly, this is a 64 y.o. male with past medical history of cutaneous lupus, IBS, GERD, anxiety, ?Wolff-Parkinson-White syndrome, history of melanoma, history of colon polyps (tubulovillous adenoma),  who comes today to undergo EGD for evaluation of episodes of abdominal pain and colonoscopy for surveillance of previous tubulovillous adenoma.  The patient Had an episode of abdominal pain in his left upper quadrant back in March 2021 that required evaluation in the ER.  He had extensive work-up there was unremarkable.  Had improvement with use of Carafate.  He was seen by his PCP who checked an H. pylori serology test which came back positive.  He received a quadruple regimen for H. pylori but did not have any repeat testing done afterwards.  The patient had a repeat episode of abdominal pain that improved again with Carafate intake.  Otherwise, denies having any symptoms since restarting the Carafate.  Was prescribed Levsin recently which she has only taking once for an episode of abdominal cramping.  Denies having any other symptom otherwise.    He had colonoscopy in 2012 that was unremarkable but he had a previous colonoscopy in 2000 that showed a 5 mm tubulovillous adenoma that was successfully removed.  Past Medical History: SEE CHRONIC ISSSUES: Past Medical History:  Diagnosis Date  . Anxiety   . Arthritis   . Esophageal reflux 05/15/1999  . Family hx colonic polyps   . Hemorrhoids   . Hiatal hernia 05/15/1999  . IBS (irritable bowel syndrome)   . Personal history of colonic polyps 05/15/1999   tubulovillous  adenoma  . Rectal fissure 04/30/1999  . Wolff-Parkinson-White (WPW) syndrome    pt states this was a mis diagnosis.   Past Surgical History:  Past Surgical History:  Procedure Laterality Date  . BACK SURGERY     x 2   . COLONOSCOPY    . KNEE SURGERY     Bilateral   . POLYPECTOMY     Family History:  Family History  Problem Relation Age of Onset  . Colon polyps Brother   . Coronary artery disease Brother 42       Stent  . Heart disease Brother   . Lung cancer Mother   . Cancer Father   . Cirrhosis Father   . Colon cancer Neg Hx    Social History:  Social History   Tobacco Use  . Smoking status: Former Smoker    Packs/day: 0.25    Years: 10.00    Pack years: 2.50    Types: Cigarettes    Quit date: 08/05/1981    Years since quitting: 38.3  . Smokeless tobacco: Never Used  Vaping Use  . Vaping Use: Never used  Substance Use Topics  . Alcohol use: No  . Drug use: No    Home Medications: '@CMEDREFRESH' @ Inpatient Medications: '@IPMED' @ Allergies: Lisinopril, Vicodin [hydrocodone-acetaminophen], Amlodipine, and Biaxin [clarithromycin]  Complete Review of Systems: ROS completed x14 and only pertinent data documented, the rest is negative, except as  above.  Physical Exam: BP (!) 137/79   Pulse 56   Temp 97.6 F (36.4 C) (Oral)   Resp 16   Ht 6' 1.5" (1.867 m)   Wt 88.5 kg   SpO2 98%   BMI 25.39 kg/m  General: Awake and alert in no distress, cooperative Skin: Skin color, texture, turgor normal. No rashes or lesions. HEENT: PERRL, EOMI, and normal dentition Neck: Supple without JVD or Lymphadenopathy Cardiac: RRR, S1S2 with no MGR Lungs: Clear to auscultation bilaterally Abdomen: Soft non-tender, non-distended, normal bowel sounds Extremities: No deformities, edema, clubbing or skin discoloration. Neurological: No focal deficit, CN 2-12 grossly intact.  Laboratory Data CBC:     Component Value Date/Time   WBC 8.5 07/27/2019 1819   RBC 4.32 07/27/2019  1819   HGB 13.4 07/27/2019 1819   HGB 13.2 08/09/2014 0140   HCT 38.3 (L) 07/27/2019 1819   HCT 39.4 (L) 08/09/2014 0140   PLT 202 07/27/2019 1819   PLT 190 08/09/2014 0140   MCV 88.7 07/27/2019 1819   MCV 90 08/09/2014 0140   MCH 31.0 07/27/2019 1819   MCHC 35.0 07/27/2019 1819   RDW 12.5 07/27/2019 1819   RDW 12.4 08/09/2014 0140   LYMPHSABS 1.0 03/02/2012 0919   MONOABS 0.4 03/02/2012 0919   EOSABS 0.1 03/02/2012 0919   BASOSABS 0.0 03/02/2012 0919   COAG: No results found for: INR, PROTIME  BMP:  BMP Latest Ref Rng & Units 12/07/2019 07/27/2019 10/16/2017  Glucose 70 - 99 mg/dL 141(H) 119(H) 127(H)  BUN 8 - 23 mg/dL 31(H) 16 16  Creatinine 0.61 - 1.24 mg/dL 1.33(H) 0.97 1.02  Sodium 135 - 145 mmol/L 138 136 136  Potassium 3.5 - 5.1 mmol/L 3.3(L) 3.8 3.8  Chloride 98 - 111 mmol/L 105 100 103  CO2 22 - 32 mmol/L '24 27 25  ' Calcium 8.9 - 10.3 mg/dL 9.1 9.8 9.1    HEPATIC:  Hepatic Function Latest Ref Rng & Units 03/02/2012 11/08/2010  Total Protein 6.0 - 8.3 g/dL 8.5(H) 8.5(H)  Albumin 3.5 - 5.2 g/dL 3.9 4.6  AST 0 - 37 U/L 21 23  ALT 0 - 53 U/L 23 24  Alk Phosphatase 39 - 117 U/L 65 67  Total Bilirubin 0.3 - 1.2 mg/dL 0.5 0.8  Bilirubin, Direct 0.0 - 0.3 mg/dL 0.1 0.1    CARDIAC:  Lab Results  Component Value Date   TROPONINI <0.03 10/16/2017    Assessment & Plan: 64 y.o. male with past medical history of cutaneous lupus, IBS, GERD, anxiety, ?Wolff-Parkinson-White syndrome, history of melanoma, history of colon polyps (tubulovillous adenoma),  who comes today to undergo EGD for evaluation of episodes of abdominal pain and colonoscopy for surveillance of previous tubulovillous adenoma.  The patient likely has symptoms related to IBS but we will explore his abdominal pain with an EGD today and obtain gastric biopsies for H. pylori.  He will undergo surveillance colonoscopy today as well.  Harvel Quale, MD Gastroenterology and Hepatology [facility]   Note: Occasional unusual wording and randomly placed punctuation marks may result from the use of speech recognition technology to transcribe this document

## 2019-12-12 ENCOUNTER — Other Ambulatory Visit: Payer: Self-pay

## 2019-12-12 LAB — SURGICAL PATHOLOGY

## 2019-12-13 ENCOUNTER — Encounter (HOSPITAL_COMMUNITY): Payer: Self-pay | Admitting: Gastroenterology

## 2019-12-13 DIAGNOSIS — E663 Overweight: Secondary | ICD-10-CM | POA: Diagnosis not present

## 2019-12-13 DIAGNOSIS — N179 Acute kidney failure, unspecified: Secondary | ICD-10-CM | POA: Diagnosis not present

## 2019-12-13 DIAGNOSIS — Z6826 Body mass index (BMI) 26.0-26.9, adult: Secondary | ICD-10-CM | POA: Diagnosis not present

## 2019-12-13 DIAGNOSIS — E876 Hypokalemia: Secondary | ICD-10-CM | POA: Diagnosis not present

## 2019-12-13 DIAGNOSIS — R7989 Other specified abnormal findings of blood chemistry: Secondary | ICD-10-CM | POA: Diagnosis not present

## 2019-12-23 DIAGNOSIS — Z4509 Encounter for adjustment and management of other cardiac device: Secondary | ICD-10-CM | POA: Diagnosis not present

## 2020-01-19 ENCOUNTER — Ambulatory Visit (INDEPENDENT_AMBULATORY_CARE_PROVIDER_SITE_OTHER): Payer: Medicare HMO | Admitting: Gastroenterology

## 2020-02-20 DIAGNOSIS — R69 Illness, unspecified: Secondary | ICD-10-CM | POA: Diagnosis not present

## 2020-02-28 DIAGNOSIS — R69 Illness, unspecified: Secondary | ICD-10-CM | POA: Diagnosis not present

## 2020-03-16 DIAGNOSIS — J329 Chronic sinusitis, unspecified: Secondary | ICD-10-CM | POA: Diagnosis not present

## 2020-03-16 DIAGNOSIS — N400 Enlarged prostate without lower urinary tract symptoms: Secondary | ICD-10-CM | POA: Diagnosis not present

## 2020-03-16 DIAGNOSIS — K219 Gastro-esophageal reflux disease without esophagitis: Secondary | ICD-10-CM | POA: Diagnosis not present

## 2020-03-16 DIAGNOSIS — Z681 Body mass index (BMI) 19 or less, adult: Secondary | ICD-10-CM | POA: Diagnosis not present

## 2020-03-23 DIAGNOSIS — Z4509 Encounter for adjustment and management of other cardiac device: Secondary | ICD-10-CM | POA: Diagnosis not present

## 2020-03-26 DIAGNOSIS — Z7982 Long term (current) use of aspirin: Secondary | ICD-10-CM | POA: Diagnosis not present

## 2020-03-26 DIAGNOSIS — Z6825 Body mass index (BMI) 25.0-25.9, adult: Secondary | ICD-10-CM | POA: Diagnosis not present

## 2020-03-26 DIAGNOSIS — I10 Essential (primary) hypertension: Secondary | ICD-10-CM | POA: Diagnosis not present

## 2020-03-26 DIAGNOSIS — Z79899 Other long term (current) drug therapy: Secondary | ICD-10-CM | POA: Diagnosis not present

## 2020-03-26 DIAGNOSIS — Z881 Allergy status to other antibiotic agents status: Secondary | ICD-10-CM | POA: Diagnosis not present

## 2020-03-26 DIAGNOSIS — Z888 Allergy status to other drugs, medicaments and biological substances status: Secondary | ICD-10-CM | POA: Diagnosis not present

## 2020-03-26 DIAGNOSIS — Z87891 Personal history of nicotine dependence: Secondary | ICD-10-CM | POA: Diagnosis not present

## 2020-03-26 DIAGNOSIS — Z4509 Encounter for adjustment and management of other cardiac device: Secondary | ICD-10-CM | POA: Diagnosis not present

## 2020-03-26 DIAGNOSIS — R002 Palpitations: Secondary | ICD-10-CM | POA: Diagnosis not present

## 2020-04-03 DIAGNOSIS — R69 Illness, unspecified: Secondary | ICD-10-CM | POA: Diagnosis not present

## 2020-04-11 DIAGNOSIS — K589 Irritable bowel syndrome without diarrhea: Secondary | ICD-10-CM | POA: Diagnosis not present

## 2020-04-11 DIAGNOSIS — K219 Gastro-esophageal reflux disease without esophagitis: Secondary | ICD-10-CM | POA: Diagnosis not present

## 2020-04-11 DIAGNOSIS — Z6824 Body mass index (BMI) 24.0-24.9, adult: Secondary | ICD-10-CM | POA: Diagnosis not present

## 2020-04-11 DIAGNOSIS — R1013 Epigastric pain: Secondary | ICD-10-CM | POA: Diagnosis not present

## 2020-04-19 DIAGNOSIS — R69 Illness, unspecified: Secondary | ICD-10-CM | POA: Diagnosis not present

## 2020-04-26 DIAGNOSIS — R32 Unspecified urinary incontinence: Secondary | ICD-10-CM | POA: Diagnosis not present

## 2020-04-26 DIAGNOSIS — M1990 Unspecified osteoarthritis, unspecified site: Secondary | ICD-10-CM | POA: Diagnosis not present

## 2020-04-26 DIAGNOSIS — K219 Gastro-esophageal reflux disease without esophagitis: Secondary | ICD-10-CM | POA: Diagnosis not present

## 2020-04-26 DIAGNOSIS — Z79899 Other long term (current) drug therapy: Secondary | ICD-10-CM | POA: Diagnosis not present

## 2020-04-26 DIAGNOSIS — R69 Illness, unspecified: Secondary | ICD-10-CM | POA: Diagnosis not present

## 2020-04-26 DIAGNOSIS — E785 Hyperlipidemia, unspecified: Secondary | ICD-10-CM | POA: Diagnosis not present

## 2020-04-26 DIAGNOSIS — Z809 Family history of malignant neoplasm, unspecified: Secondary | ICD-10-CM | POA: Diagnosis not present

## 2020-04-26 DIAGNOSIS — I499 Cardiac arrhythmia, unspecified: Secondary | ICD-10-CM | POA: Diagnosis not present

## 2020-04-26 DIAGNOSIS — Z008 Encounter for other general examination: Secondary | ICD-10-CM | POA: Diagnosis not present

## 2020-04-26 DIAGNOSIS — I10 Essential (primary) hypertension: Secondary | ICD-10-CM | POA: Diagnosis not present

## 2020-04-26 DIAGNOSIS — K2970 Gastritis, unspecified, without bleeding: Secondary | ICD-10-CM | POA: Diagnosis not present

## 2020-06-03 IMAGING — US US ABDOMEN COMPLETE
1 series · 14 of 25 positions shown · non-contrast
Comparison: CT abdomen and pelvis 01/18/2013.

CLINICAL DATA: Stab dominant pain for 2 months.

EXAM:
ABDOMEN ULTRASOUND COMPLETE

[Series 1: us abdomen complete · 0.15mm/px · 14 of 107 slices shown]
[im 1/107]
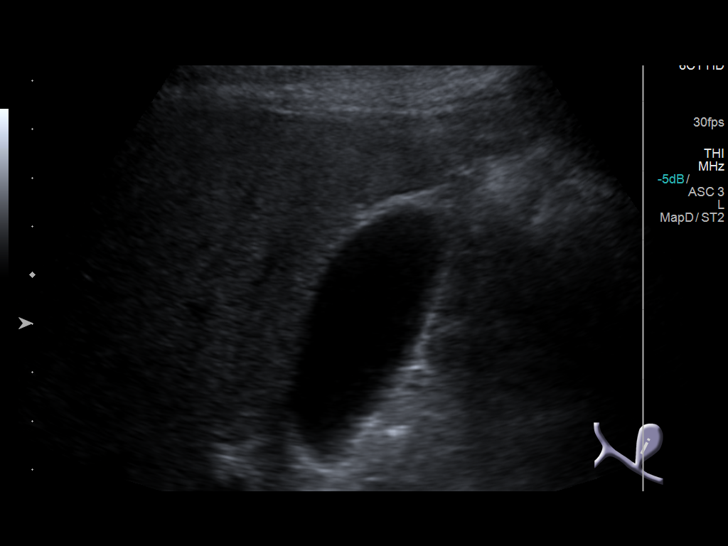
[im 9/107]
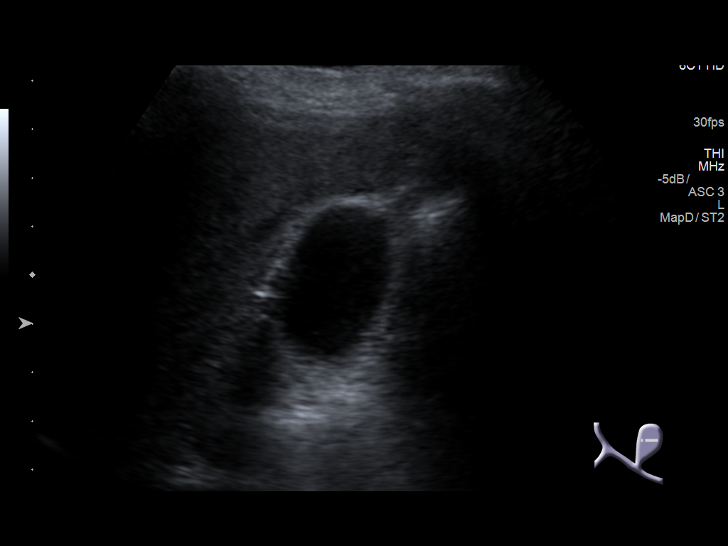
[im 18/107]
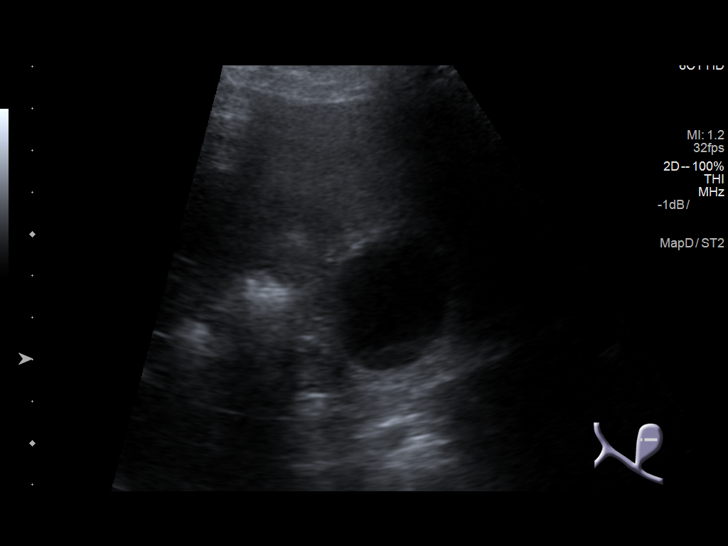
[im 27/107]
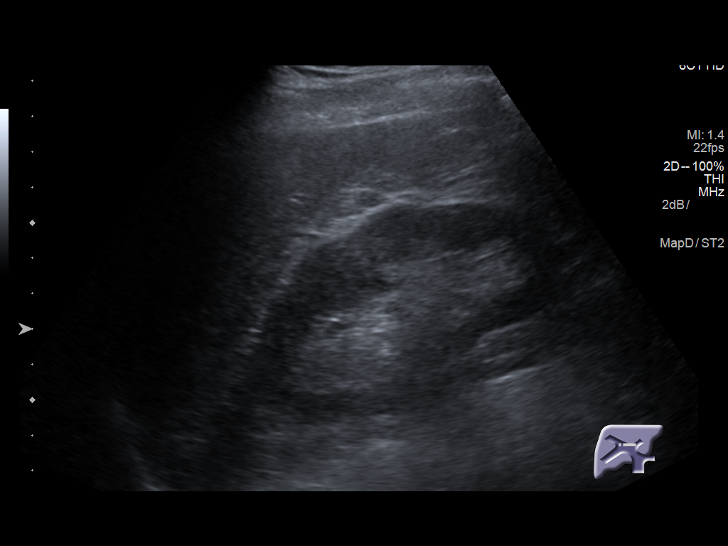
[im 36/107]
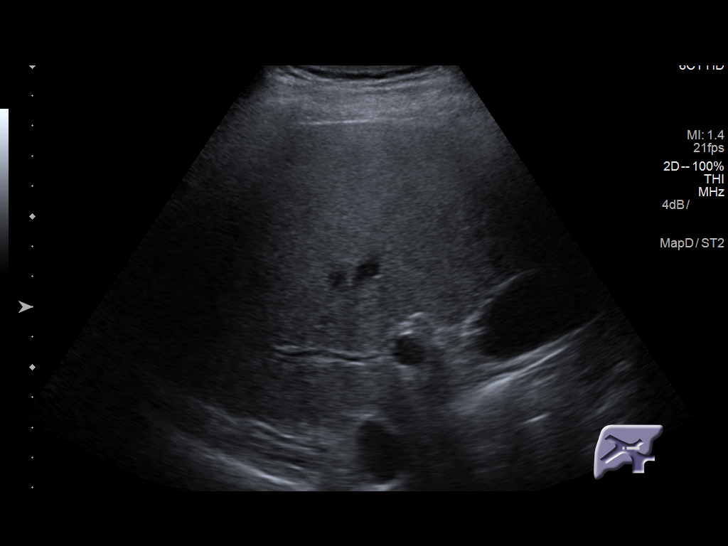
[im 40/107]
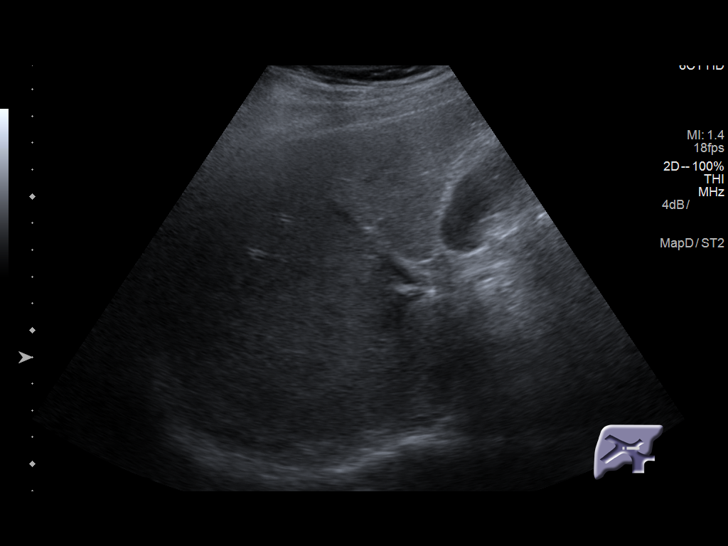
[im 49/107]
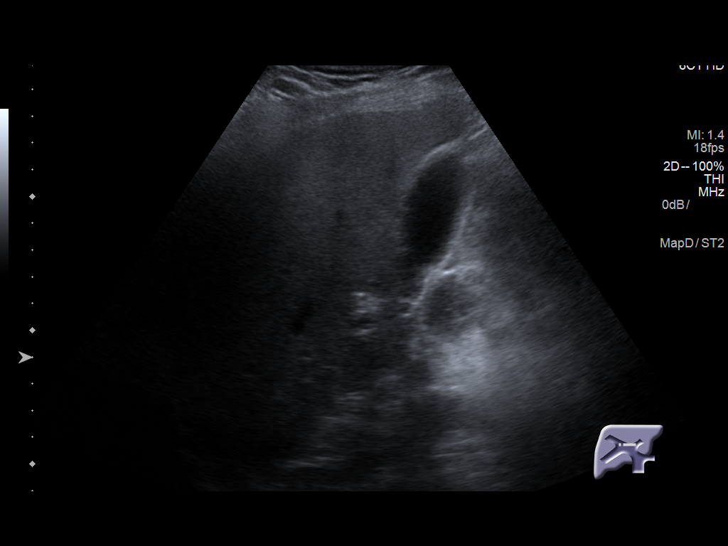
[im 58/107]
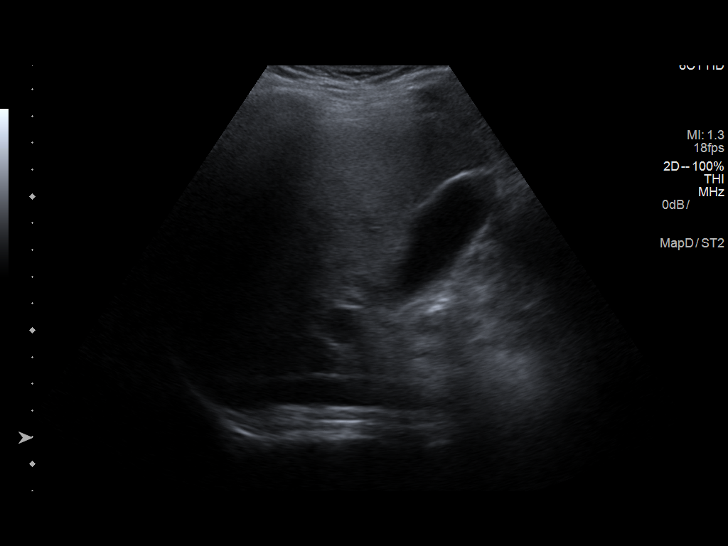
[im 67/107]
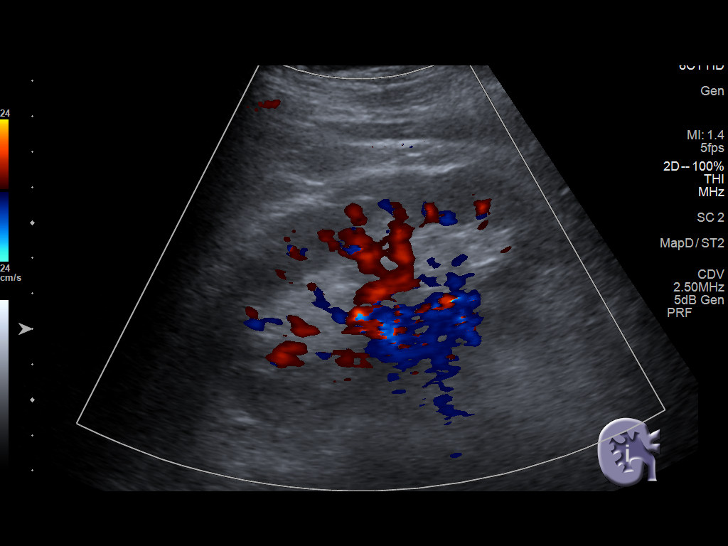
[im 71/107]
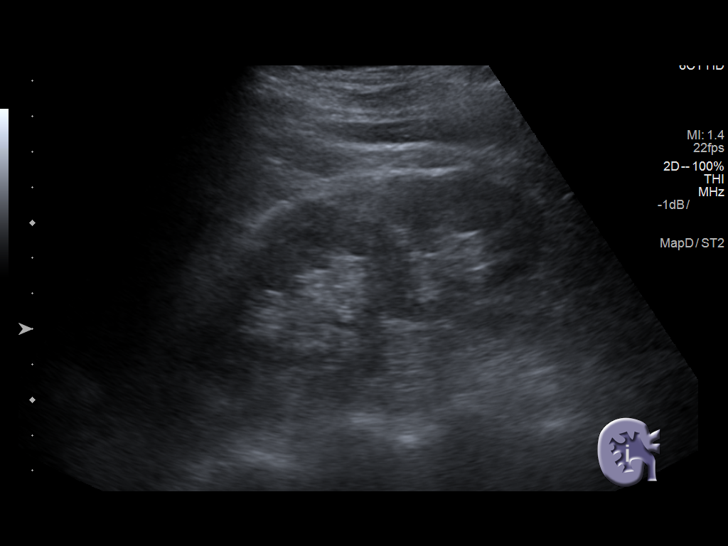
[im 80/107]
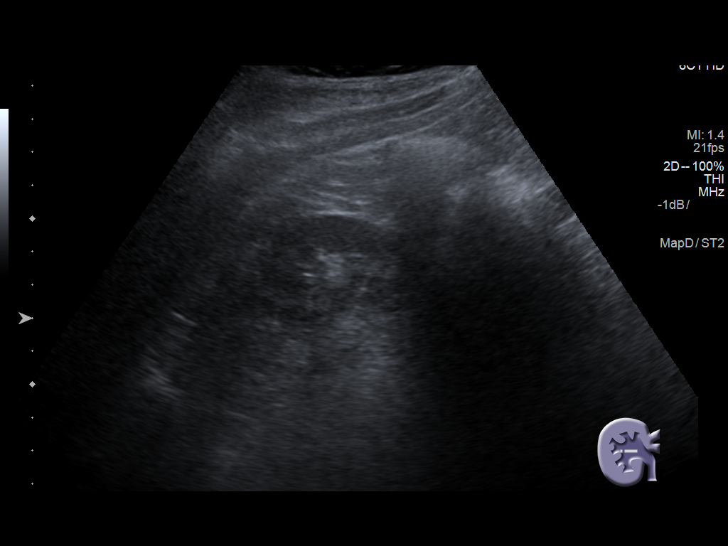
[im 89/107]
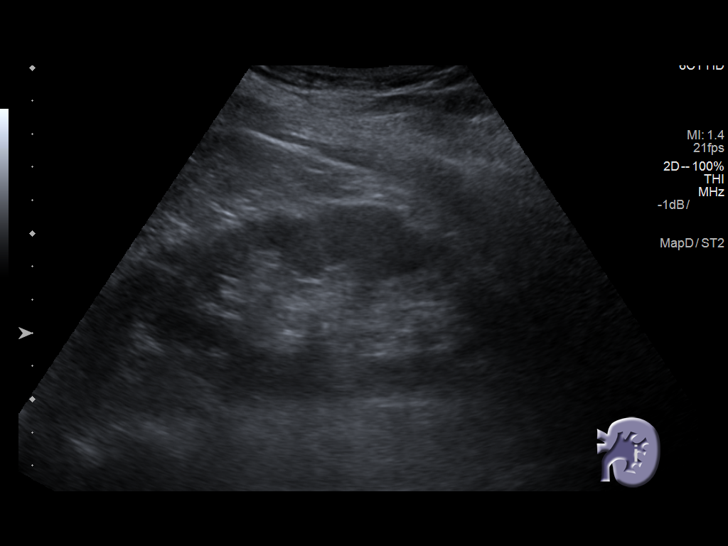
[im 98/107]
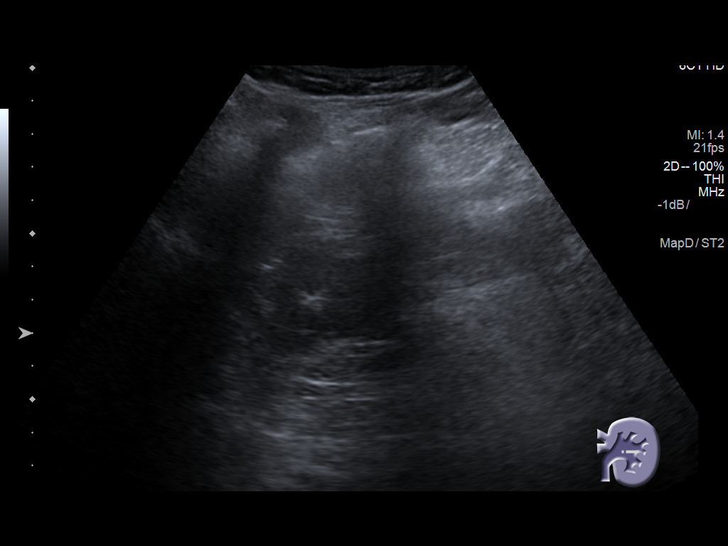
[im 107/107]
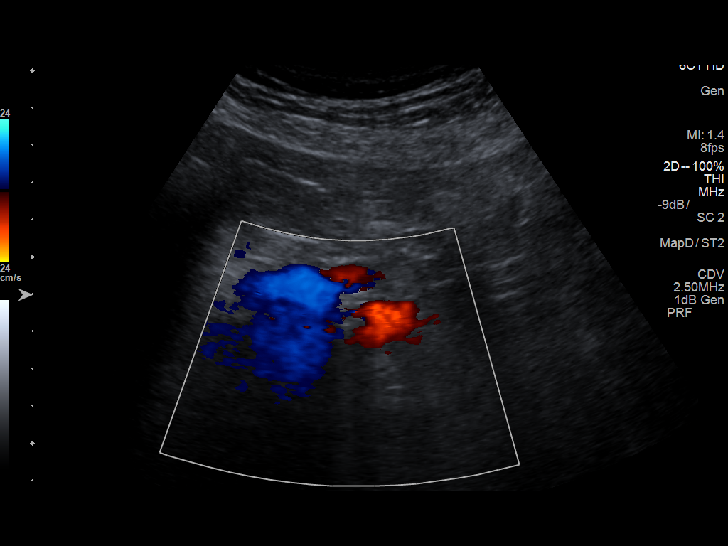

[14 of 25 positions shown; findings below may reference images not displayed]

FINDINGS: Gallbladder: No gallstones or wall thickening visualized. No
sonographic Murphy sign noted by sonographer.

Common bile duct: Diameter: 0.3 cm

Liver: No focal lesion is seen. Echogenicity is mildly increased.
Portal vein is patent on color Doppler imaging with normal direction
of blood flow towards the liver.

IVC: No abnormality visualized.

Pancreas: Not visualized due to overlying bowel gas.

Spleen: Size and appearance within normal limits.

Right Kidney: Length: 10.5 cm. Echogenicity within normal limits. No
mass or hydronephrosis visualized.

Left Kidney: Length: 11.6 cm. Echogenicity within normal limits. No
mass or hydronephrosis visualized.

Abdominal aorta: No aneurysm visualized.

Other findings: None.
IMPRESSION: Mild fatty infiltration of the liver. The study is otherwise
negative. No gallstones.

## 2020-06-08 DIAGNOSIS — R42 Dizziness and giddiness: Secondary | ICD-10-CM | POA: Diagnosis not present

## 2020-06-08 DIAGNOSIS — I10 Essential (primary) hypertension: Secondary | ICD-10-CM | POA: Diagnosis not present

## 2020-06-08 DIAGNOSIS — J329 Chronic sinusitis, unspecified: Secondary | ICD-10-CM | POA: Diagnosis not present

## 2020-06-22 DIAGNOSIS — Z6825 Body mass index (BMI) 25.0-25.9, adult: Secondary | ICD-10-CM | POA: Diagnosis not present

## 2020-06-22 DIAGNOSIS — E7849 Other hyperlipidemia: Secondary | ICD-10-CM | POA: Diagnosis not present

## 2020-06-22 DIAGNOSIS — Z79899 Other long term (current) drug therapy: Secondary | ICD-10-CM | POA: Diagnosis not present

## 2020-06-22 DIAGNOSIS — Z4509 Encounter for adjustment and management of other cardiac device: Secondary | ICD-10-CM | POA: Diagnosis not present

## 2020-06-22 DIAGNOSIS — I10 Essential (primary) hypertension: Secondary | ICD-10-CM | POA: Diagnosis not present

## 2020-06-22 DIAGNOSIS — L930 Discoid lupus erythematosus: Secondary | ICD-10-CM | POA: Diagnosis not present

## 2020-06-22 DIAGNOSIS — Z125 Encounter for screening for malignant neoplasm of prostate: Secondary | ICD-10-CM | POA: Diagnosis not present

## 2020-06-22 DIAGNOSIS — E663 Overweight: Secondary | ICD-10-CM | POA: Diagnosis not present

## 2020-06-22 DIAGNOSIS — Z23 Encounter for immunization: Secondary | ICD-10-CM | POA: Diagnosis not present

## 2020-06-22 DIAGNOSIS — L931 Subacute cutaneous lupus erythematosus: Secondary | ICD-10-CM | POA: Diagnosis not present

## 2020-07-27 DIAGNOSIS — I493 Ventricular premature depolarization: Secondary | ICD-10-CM | POA: Diagnosis not present

## 2020-07-27 DIAGNOSIS — Z6825 Body mass index (BMI) 25.0-25.9, adult: Secondary | ICD-10-CM | POA: Diagnosis not present

## 2020-07-27 DIAGNOSIS — I10 Essential (primary) hypertension: Secondary | ICD-10-CM | POA: Diagnosis not present

## 2020-07-27 DIAGNOSIS — Z7689 Persons encountering health services in other specified circumstances: Secondary | ICD-10-CM | POA: Diagnosis not present

## 2020-07-27 DIAGNOSIS — Z1331 Encounter for screening for depression: Secondary | ICD-10-CM | POA: Diagnosis not present

## 2020-07-27 DIAGNOSIS — Z0000 Encounter for general adult medical examination without abnormal findings: Secondary | ICD-10-CM | POA: Diagnosis not present

## 2020-07-27 DIAGNOSIS — R7309 Other abnormal glucose: Secondary | ICD-10-CM | POA: Diagnosis not present

## 2020-07-27 DIAGNOSIS — L931 Subacute cutaneous lupus erythematosus: Secondary | ICD-10-CM | POA: Diagnosis not present

## 2020-09-21 DIAGNOSIS — Z4509 Encounter for adjustment and management of other cardiac device: Secondary | ICD-10-CM | POA: Diagnosis not present

## 2020-09-21 DIAGNOSIS — R55 Syncope and collapse: Secondary | ICD-10-CM | POA: Diagnosis not present

## 2020-09-21 DIAGNOSIS — R002 Palpitations: Secondary | ICD-10-CM | POA: Diagnosis not present

## 2020-11-09 DIAGNOSIS — H6691 Otitis media, unspecified, right ear: Secondary | ICD-10-CM | POA: Diagnosis not present

## 2020-11-09 DIAGNOSIS — Z681 Body mass index (BMI) 19 or less, adult: Secondary | ICD-10-CM | POA: Diagnosis not present

## 2020-11-09 DIAGNOSIS — J069 Acute upper respiratory infection, unspecified: Secondary | ICD-10-CM | POA: Diagnosis not present

## 2020-11-09 DIAGNOSIS — H1033 Unspecified acute conjunctivitis, bilateral: Secondary | ICD-10-CM | POA: Diagnosis not present

## 2020-12-21 DIAGNOSIS — Z4509 Encounter for adjustment and management of other cardiac device: Secondary | ICD-10-CM | POA: Diagnosis not present

## 2021-01-11 DIAGNOSIS — R519 Headache, unspecified: Secondary | ICD-10-CM | POA: Diagnosis not present

## 2021-01-11 DIAGNOSIS — M1990 Unspecified osteoarthritis, unspecified site: Secondary | ICD-10-CM | POA: Diagnosis not present

## 2021-01-11 DIAGNOSIS — M3219 Other organ or system involvement in systemic lupus erythematosus: Secondary | ICD-10-CM | POA: Diagnosis not present

## 2021-01-11 DIAGNOSIS — Z791 Long term (current) use of non-steroidal anti-inflammatories (NSAID): Secondary | ICD-10-CM | POA: Diagnosis not present

## 2021-01-11 DIAGNOSIS — K219 Gastro-esophageal reflux disease without esophagitis: Secondary | ICD-10-CM | POA: Diagnosis not present

## 2021-01-11 DIAGNOSIS — Z7982 Long term (current) use of aspirin: Secondary | ICD-10-CM | POA: Diagnosis not present

## 2021-01-11 DIAGNOSIS — I10 Essential (primary) hypertension: Secondary | ICD-10-CM | POA: Diagnosis not present

## 2021-01-11 DIAGNOSIS — E785 Hyperlipidemia, unspecified: Secondary | ICD-10-CM | POA: Diagnosis not present

## 2021-01-11 DIAGNOSIS — Z7722 Contact with and (suspected) exposure to environmental tobacco smoke (acute) (chronic): Secondary | ICD-10-CM | POA: Diagnosis not present

## 2021-01-11 DIAGNOSIS — G8929 Other chronic pain: Secondary | ICD-10-CM | POA: Diagnosis not present

## 2021-02-01 ENCOUNTER — Encounter: Payer: Self-pay | Admitting: Internal Medicine

## 2021-02-28 DIAGNOSIS — Z4509 Encounter for adjustment and management of other cardiac device: Secondary | ICD-10-CM | POA: Diagnosis not present

## 2021-04-01 DIAGNOSIS — Z4509 Encounter for adjustment and management of other cardiac device: Secondary | ICD-10-CM | POA: Diagnosis not present

## 2021-04-17 DIAGNOSIS — Z7982 Long term (current) use of aspirin: Secondary | ICD-10-CM | POA: Diagnosis not present

## 2021-04-17 DIAGNOSIS — Z6825 Body mass index (BMI) 25.0-25.9, adult: Secondary | ICD-10-CM | POA: Diagnosis not present

## 2021-04-17 DIAGNOSIS — Z4509 Encounter for adjustment and management of other cardiac device: Secondary | ICD-10-CM | POA: Diagnosis not present

## 2021-04-17 DIAGNOSIS — Z79899 Other long term (current) drug therapy: Secondary | ICD-10-CM | POA: Diagnosis not present

## 2021-04-17 DIAGNOSIS — I10 Essential (primary) hypertension: Secondary | ICD-10-CM | POA: Diagnosis not present

## 2021-04-17 DIAGNOSIS — Z87891 Personal history of nicotine dependence: Secondary | ICD-10-CM | POA: Diagnosis not present

## 2021-04-17 DIAGNOSIS — Z881 Allergy status to other antibiotic agents status: Secondary | ICD-10-CM | POA: Diagnosis not present

## 2021-04-17 DIAGNOSIS — I951 Orthostatic hypotension: Secondary | ICD-10-CM | POA: Diagnosis not present

## 2021-04-17 DIAGNOSIS — R002 Palpitations: Secondary | ICD-10-CM | POA: Diagnosis not present

## 2021-04-17 DIAGNOSIS — Z4502 Encounter for adjustment and management of automatic implantable cardiac defibrillator: Secondary | ICD-10-CM | POA: Diagnosis not present

## 2021-04-17 DIAGNOSIS — K219 Gastro-esophageal reflux disease without esophagitis: Secondary | ICD-10-CM | POA: Diagnosis not present

## 2021-05-14 DIAGNOSIS — L931 Subacute cutaneous lupus erythematosus: Secondary | ICD-10-CM | POA: Diagnosis not present

## 2021-05-14 DIAGNOSIS — R109 Unspecified abdominal pain: Secondary | ICD-10-CM | POA: Diagnosis not present

## 2021-05-14 DIAGNOSIS — K219 Gastro-esophageal reflux disease without esophagitis: Secondary | ICD-10-CM | POA: Diagnosis not present

## 2021-05-14 DIAGNOSIS — I493 Ventricular premature depolarization: Secondary | ICD-10-CM | POA: Diagnosis not present

## 2021-05-14 DIAGNOSIS — E663 Overweight: Secondary | ICD-10-CM | POA: Diagnosis not present

## 2021-05-14 DIAGNOSIS — Z6825 Body mass index (BMI) 25.0-25.9, adult: Secondary | ICD-10-CM | POA: Diagnosis not present

## 2021-06-13 DIAGNOSIS — Z6825 Body mass index (BMI) 25.0-25.9, adult: Secondary | ICD-10-CM | POA: Diagnosis not present

## 2021-06-13 DIAGNOSIS — R002 Palpitations: Secondary | ICD-10-CM | POA: Diagnosis not present

## 2021-06-13 DIAGNOSIS — I456 Pre-excitation syndrome: Secondary | ICD-10-CM | POA: Diagnosis not present

## 2021-06-13 DIAGNOSIS — Z4509 Encounter for adjustment and management of other cardiac device: Secondary | ICD-10-CM | POA: Diagnosis not present

## 2021-06-16 DIAGNOSIS — I456 Pre-excitation syndrome: Secondary | ICD-10-CM | POA: Diagnosis not present

## 2021-06-16 IMAGING — CT CT HEAD WITHOUT CONTRAST
3 of 4 series · 14 of 47 positions shown, 16 images · non-contrast
Comparison: None.

CLINICAL DATA: Headaches and blurred vision. Occasional episodes of
presyncope

EXAM:
CT HEAD WITHOUT CONTRAST
TECHNIQUE: Contiguous axial images were obtained from the base of the skull
through the vertex without intravenous contrast.

[Series 2: head · axial · 0.39mm/px · z∈[-518,-398]mm · 8 of 30 slices shown, 10 images]
[im 3/30  brain]
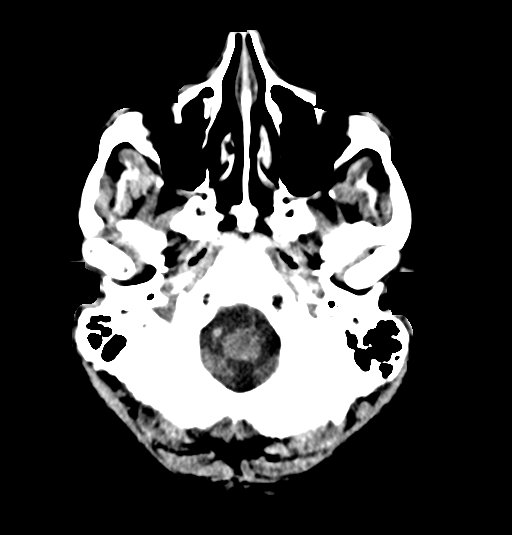
[im 3/30  bone]
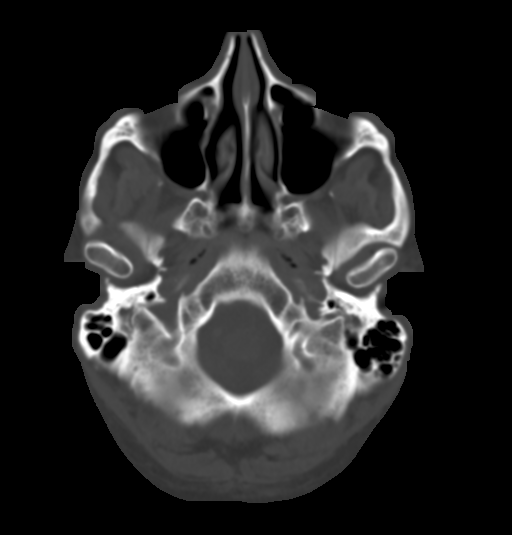
[im 7/30  brain]
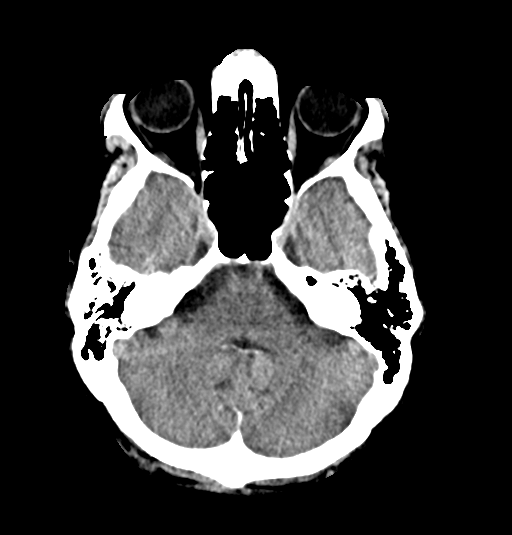
[im 11/30  brain]
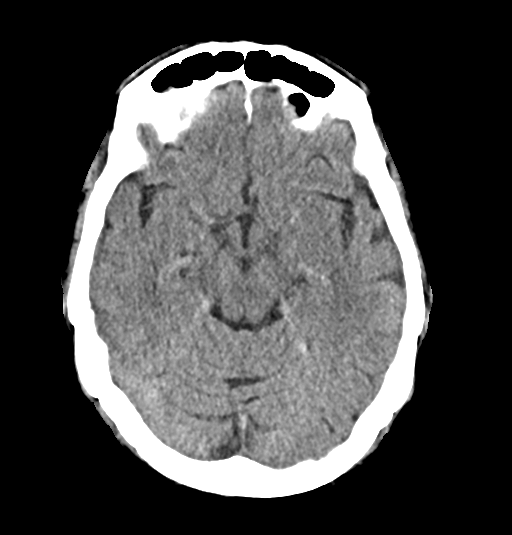
[im 13/30  brain]
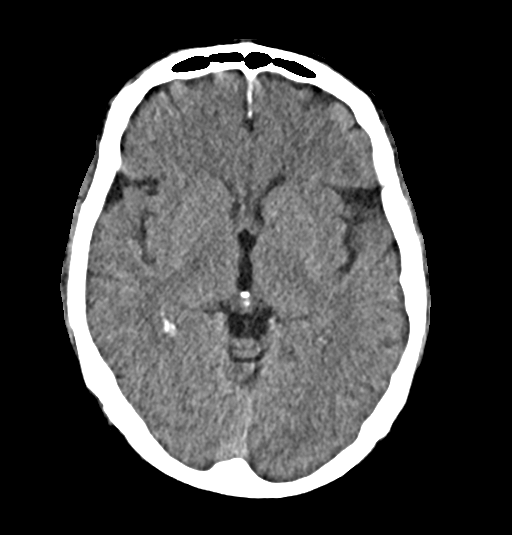
[im 17/30  brain]
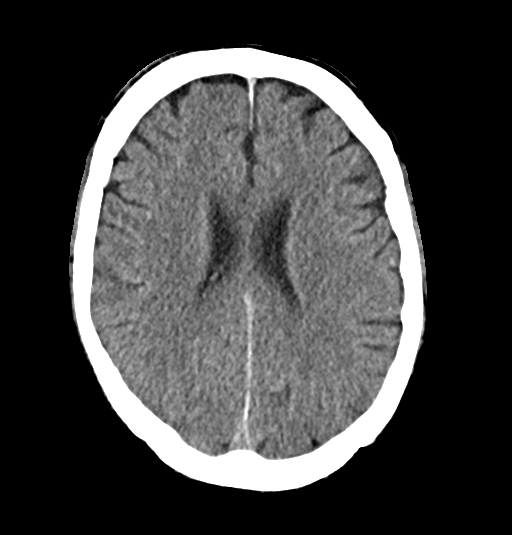
[im 17/30  bone]
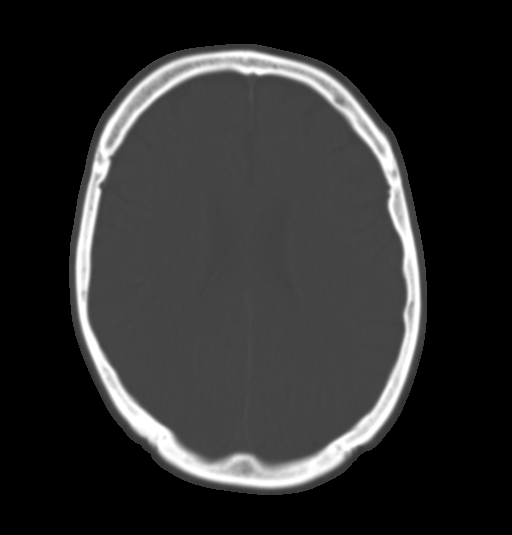
[im 19/30  brain]
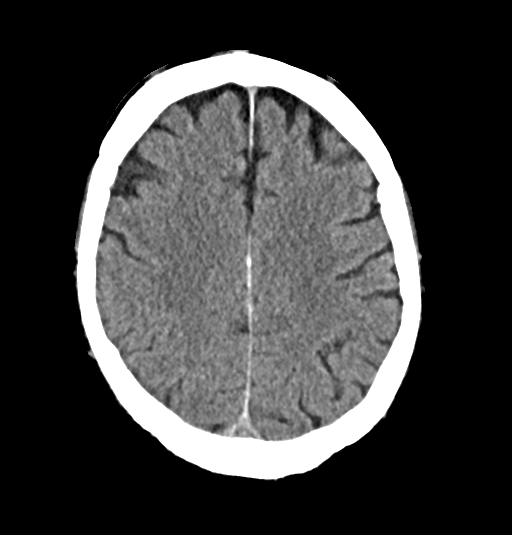
[im 23/30  brain]
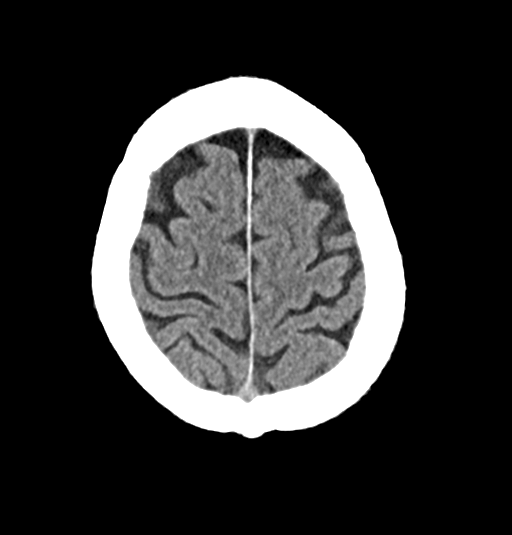
[im 27/30  brain]
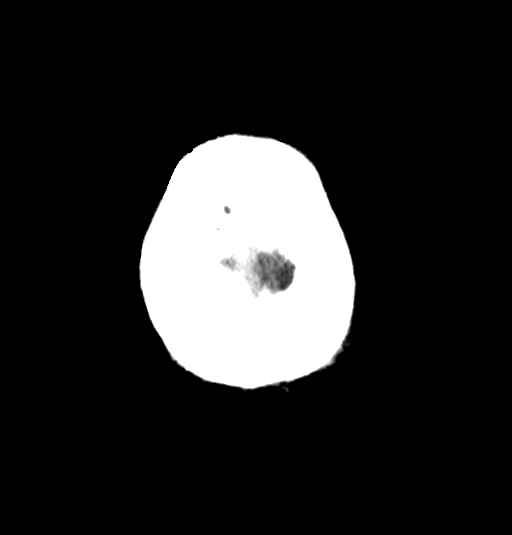

[Series 6: coronal head · coronal · 0.31mm/px · 3 of 69 slices shown]
[im 23/69  brain]
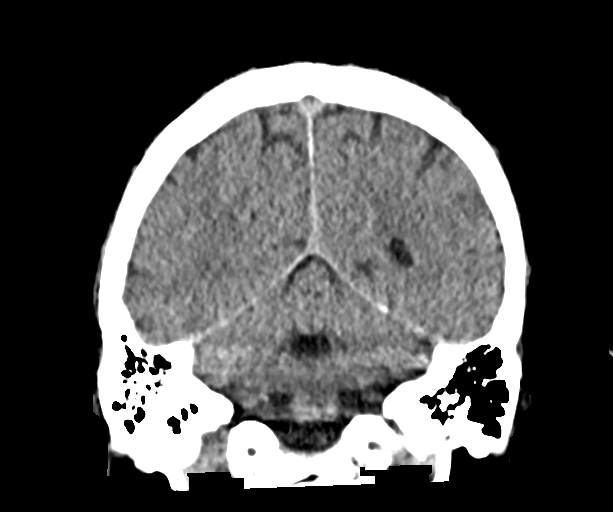
[im 31/69  brain]
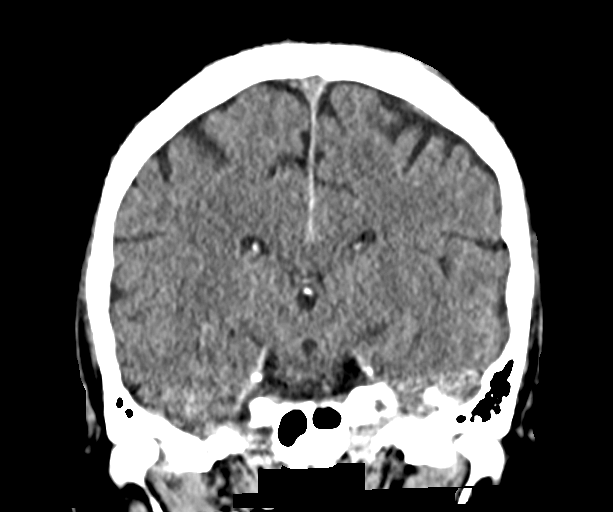
[im 38/69  brain]
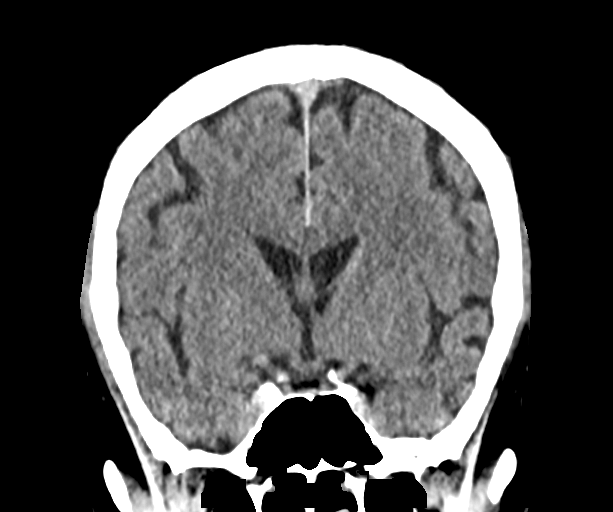

[Series 8: sagittal head · sagittal · 0.31mm/px · 3 of 64 slices shown]
[im 22/64  brain]
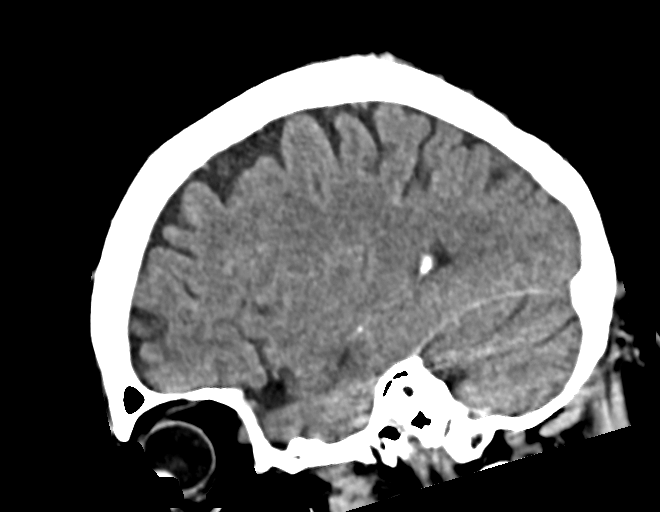
[im 32/64  brain]
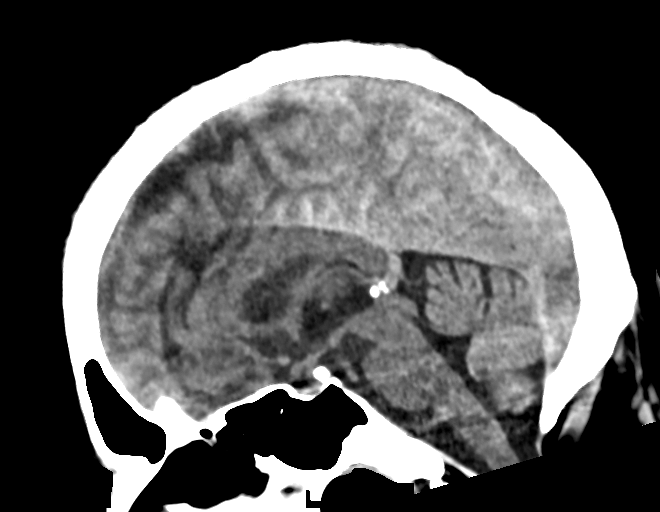
[im 43/64  brain]
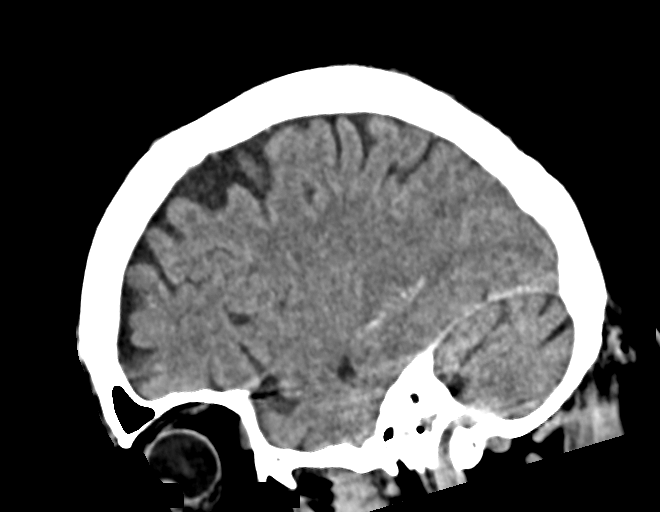

[14 of 47 positions shown; findings below may reference images not displayed]

FINDINGS: Brain: The ventricles are normal in size and configuration. There is
no intracranial mass, hemorrhage, extra-axial fluid collection, or
midline shift. The brain parenchyma appears unremarkable. No acute
infarct is demonstrable.

Vascular: No hyperdense vessel. There is no appreciable vascular
calcification.

Skull: The bony calvarium appears intact.

Sinuses/Orbits: There is mucosal thickening in the posterior left
maxillary antrum. Other visualized paranasal sinuses are clear.
Orbits appear symmetric bilaterally.

Other: Mastoid air cells are clear.
IMPRESSION: Mild mucosal thickening in the left maxillary antrum. Study
otherwise unremarkable.

## 2021-06-25 DIAGNOSIS — R0982 Postnasal drip: Secondary | ICD-10-CM | POA: Diagnosis not present

## 2021-06-25 DIAGNOSIS — J019 Acute sinusitis, unspecified: Secondary | ICD-10-CM | POA: Diagnosis not present

## 2021-07-01 DIAGNOSIS — Z4509 Encounter for adjustment and management of other cardiac device: Secondary | ICD-10-CM | POA: Diagnosis not present

## 2021-08-18 ENCOUNTER — Emergency Department
Admission: EM | Admit: 2021-08-18 | Discharge: 2021-08-18 | Disposition: A | Discharge status: Home / Self Care | Payer: Medicare HMO | Attending: Emergency Medicine | Admitting: Emergency Medicine

## 2021-08-18 ENCOUNTER — Other Ambulatory Visit: Payer: Self-pay

## 2021-08-18 DIAGNOSIS — R0789 Other chest pain: Secondary | ICD-10-CM | POA: Diagnosis not present

## 2021-08-18 DIAGNOSIS — Z743 Need for continuous supervision: Secondary | ICD-10-CM | POA: Diagnosis not present

## 2021-08-18 DIAGNOSIS — R002 Palpitations: Secondary | ICD-10-CM | POA: Insufficient documentation

## 2021-08-18 DIAGNOSIS — R7989 Other specified abnormal findings of blood chemistry: Secondary | ICD-10-CM | POA: Insufficient documentation

## 2021-08-18 DIAGNOSIS — R079 Chest pain, unspecified: Secondary | ICD-10-CM | POA: Diagnosis not present

## 2021-08-18 DIAGNOSIS — I10 Essential (primary) hypertension: Secondary | ICD-10-CM | POA: Diagnosis not present

## 2021-08-18 LAB — CBC WITH DIFFERENTIAL/PLATELET
Abs Immature Granulocytes: 0.01 10*3/uL (ref 0.00–0.07)
Basophils Absolute: 0 10*3/uL (ref 0.0–0.1)
Basophils Relative: 0 %
Eosinophils Absolute: 0.2 10*3/uL (ref 0.0–0.5)
Eosinophils Relative: 4 %
HCT: 40.4 % (ref 39.0–52.0)
Hemoglobin: 14 g/dL (ref 13.0–17.0)
Immature Granulocytes: 0 %
Lymphocytes Relative: 38 %
Lymphs Abs: 2 10*3/uL (ref 0.7–4.0)
MCH: 31.3 pg (ref 26.0–34.0)
MCHC: 34.7 g/dL (ref 30.0–36.0)
MCV: 90.4 fL (ref 80.0–100.0)
Monocytes Absolute: 0.7 10*3/uL (ref 0.1–1.0)
Monocytes Relative: 13 %
Neutro Abs: 2.4 10*3/uL (ref 1.7–7.7)
Neutrophils Relative %: 45 %
Platelets: 204 10*3/uL (ref 150–400)
RBC: 4.47 MIL/uL (ref 4.22–5.81)
RDW: 12 % (ref 11.5–15.5)
WBC: 5.3 10*3/uL (ref 4.0–10.5)
nRBC: 0 % (ref 0.0–0.2)

## 2021-08-18 LAB — BASIC METABOLIC PANEL
Anion gap: 9 (ref 5–15)
BUN: 30 mg/dL — ABNORMAL HIGH (ref 8–23)
CO2: 26 mmol/L (ref 22–32)
Calcium: 9.8 mg/dL (ref 8.9–10.3)
Chloride: 102 mmol/L (ref 98–111)
Creatinine, Ser: 1.11 mg/dL (ref 0.61–1.24)
GFR, Estimated: >60 mL/min (ref >60)
Glucose, Bld: 141 mg/dL — ABNORMAL HIGH (ref 70–99)
Potassium: 3.8 mmol/L (ref 3.5–5.1)
Sodium: 137 mmol/L (ref 135–145)

## 2021-08-18 LAB — MAGNESIUM: Magnesium: 2 mg/dL (ref 1.7–2.4)

## 2021-08-18 LAB — TROPONIN I (HIGH SENSITIVITY)
Troponin I (High Sensitivity): 11 ng/L (ref <18)
Troponin I (High Sensitivity): 17 ng/L (ref <18)

## 2021-08-18 LAB — PROTIME-INR
INR: 1 (ref 0.8–1.2)
Prothrombin Time: 13.1 seconds (ref 11.4–15.2)

## 2021-08-18 NOTE — ED Triage Notes (Signed)
To room 17 from home via [facility]. Pt reports chest pain onset apx 12 hours ago, right side, sharp in nature. Per EMS pt with new onset Afib with rate 120s-170s, and pt converted on his own to NSR with a rate of 86 without any intervention. Pt reports pain has improved currently. Took 324 ASA PTA. Pt states he has had an "implanted heart monitor" for 3-5 years but reports that is not a defibrillator or pacemaker.  ?

## 2021-08-18 NOTE — ED Notes (Signed)
U/S in room at this time. 

## 2021-08-18 NOTE — ED Notes (Signed)
D/C and reasons to return dicussed with pt, pt verbalized understanding. NAD noted on D/C. VSS.  ?

## 2021-08-18 NOTE — ED Provider Notes (Signed)
? ?[facility] ?Provider Note ? ? ? Event Date/Time  ? First MD Initiated Contact with Patient 08/18/21 0327   ?  (approximate) ? ? ?History  ? ?Chest Pain ? ? ?HPI ? ?Peter Flores is a 66 y.o. male with a history of palpitations with heart monitor for the last several years.  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  He goes to cardiology at [facility]. ? ?He presents by EMS for evaluation of palpitations and some chest pain.  He had a somewhat unusual night.  He and his wife went out for the evening, had greasy food which may have exacerbated his chronic GERD, they went dancing, had a few beers, and sex after coming home.  Immediately after sexual intercourse he developed palpitations and some chest pain.  It felt different and worse than normal when he has palpitations.  They immediately called the ambulance.  They initially reported that he had a rate between the 120s to 170s but on his own went back to normal sinus rhythm and he has had a normal heart rate since then.  He no longer feels any abnormal symptoms.  He was given 324 mg of aspirin by EMS prior to arrival. ? ?The patient has not been ill recently.  He denies fever, shortness of breath, nausea, vomiting.  He has not had a heart attack.  He is quite anxious particularly when it comes to his cardiac symptoms. ? ?He currently is not experiencing any symptoms. ?  ? ? ?Physical Exam  ? ?Triage Vital Signs: ?ED Triage Vitals  ?Enc Vitals Group  ?   BP 08/18/21 0325 (!) 176/84  ?   Pulse Rate 08/18/21 0323 82  ?   Resp 08/18/21 0323 18  ?   Temp 08/18/21 0323 98 ?F (36.7 ?C)  ?   Temp Source 08/18/21 0323 Oral  ?   SpO2 08/18/21 0323 99 %  ?   Weight 08/18/21 0324 90.7 kg (200 lb)  ?   Height 08/18/21 0324 1.88 m ('6\' 2"'$ )  ?   Head Circumference --   ?   Peak Flow --   ?   Pain Score 08/18/21 0324 1  ?   Pain Loc --   ?   Pain Edu? --   ?   Excl. in [place]? --   ? ? ?Most recent vital signs: ?Vitals:  ? 08/18/21 0700 08/18/21 0730  ?BP: (!)  149/82 (!) 146/86  ?Pulse: (!) 58 (!) 59  ?Resp: 12 16  ?Temp:    ?SpO2: 99% 97%  ? ? ? ?General: Awake, no distress.  ?CV:  Good peripheral perfusion.  Normal heart sounds.  Regular rate and rhythm.  Implanted heart monitor. ?Resp:  Normal effort.  Lungs are clear to auscultation. ?Abd:  No distention.  ? ? ?ED Results / Procedures / Treatments  ? ?Labs ?(all labs ordered are listed, but only abnormal results are displayed) ?Labs Reviewed  ?BASIC METABOLIC PANEL - Abnormal; Notable for the following components:  ?    Result Value  ? Glucose, Bld 141 (*)   ? BUN 30 (*)   ? All other components within normal limits  ?CBC WITH DIFFERENTIAL/PLATELET  ?PROTIME-INR  ?MAGNESIUM  ?TROPONIN I (HIGH SENSITIVITY)  ?TROPONIN I (HIGH SENSITIVITY)  ? ? ? ?EKG ? ?ED ECG REPORT ?IHinda Kehr, the attending physician, personally viewed and interpreted this ECG. ? ?Date: 08/18/2021 ?EKG Time: 3:22 AM ?Rate: 78 ?Rhythm: Sinus  rhythm with incomplete right bundle branch block and left anterior fascicular block ?QRS Axis: normal ?Intervals: RSR' in V2 ?ST/T Wave abnormalities: Non-specific ST segment / T-wave changes, but no clear evidence of acute ischemia. ?Narrative Interpretation: no definitive evidence of acute ischemia; does not meet STEMI criteria. ? ? ? ?PROCEDURES: ? ?Critical Care performed: No ? ?.1-3 Lead EKG Interpretation ?Performed by: Hinda Kehr, MD ?Authorized by: Hinda Kehr, MD  ? ?  Interpretation: normal   ?  ECG rate:  80 ?  ECG rate assessment: normal   ?  Rhythm: sinus rhythm   ?  Ectopy: none   ?  Conduction: normal   ? ? ?MEDICATIONS ORDERED IN ED: ?Medications - No data to display ? ? ?IMPRESSION / MDM / ASSESSMENT AND PLAN / ED COURSE  ?I reviewed the triage vital signs and the nursing notes. ?             ?               ? ?Differential diagnosis includes, but is not limited to, PVCs, SVT, new onset A-fib, other nonspecific cardiac arrhythmia. ? ?The patient is on the cardiac monitor to evaluate for  evidence of arrhythmia and/or significant heart rate changes. ? ?Patient is asymptomatic currently.  Hypertensive in the 716R systolic but otherwise reassuring vital signs. ? ?EKG shows no significant abnormalities, no arrhythmia nor evidence of ischemia. ? ?Labs ordered include CBC, pro time INR, magnesium, BMP, and high-sensitivity troponin. ? ?I reviewed the results and his basic metabolic panel is essentially normal, CBC with differential is within normal limits, pro time INR is normal, magnesium is normal.  High-sensitivity troponin is also within normal limits. ? ?We will repeat high-sensitivity troponin.  No indication for imaging.  I doubt ACS or PE.  I suspect he had a brief episode of a nonspecific arrhythmia, possibly SVT, but that seems to have resolved.  He and his wife agree with the plan for observation, repeat troponin, and reassessment. ? ?Clinical Course as of 08/18/21 0836  ?Sun Aug 18, 2021  ?0709 The patient's repeat troponin was within normal limits at 17.  This is a delta of 6, but the patient has had no additional symptoms.  I feel that he is not having an acute coronary event and that he is safe to go home and follow-up with his cardiologist at [facility].  The patient and his wife very much want to go home and agree with this plan.  I gave my usual and customary return precautions and they agree with the plan. [CF]  ?  ?Clinical Course User Index ?[CF] Hinda Kehr, MD  ? ? ? ?FINAL CLINICAL IMPRESSION(S) / ED DIAGNOSES  ? ?Final diagnoses:  ?Atypical chest pain  ?Palpitations  ? ? ? ?Rx / DC Orders  ? ?ED Discharge Orders   ? ? None  ? ?  ? ? ? ?Note:  This document was prepared using Dragon voice recognition software and may include unintentional dictation errors. ?  ?Hinda Kehr, MD ?08/18/21 [id number] ? ?

## 2021-08-18 NOTE — ED Notes (Signed)
Pt states internal heart monitor is due to HX of PVCs and "heart rate going low."  ?

## 2021-08-18 NOTE — Discharge Instructions (Signed)
Your workup in the Emergency Department today was reassuring.  We did not find any specific abnormalities.  We recommend you drink plenty of fluids, take your regular medications and/or any new ones prescribed today, and follow up with the doctor(s) listed in these documents as recommended.  Return to the Emergency Department if you develop new or worsening symptoms that concern you.  

## 2021-08-22 DIAGNOSIS — R001 Bradycardia, unspecified: Secondary | ICD-10-CM | POA: Diagnosis not present

## 2021-08-22 DIAGNOSIS — I10 Essential (primary) hypertension: Secondary | ICD-10-CM | POA: Diagnosis not present

## 2021-08-22 DIAGNOSIS — Z87891 Personal history of nicotine dependence: Secondary | ICD-10-CM | POA: Diagnosis not present

## 2021-08-22 DIAGNOSIS — Z6825 Body mass index (BMI) 25.0-25.9, adult: Secondary | ICD-10-CM | POA: Diagnosis not present

## 2021-08-22 DIAGNOSIS — I456 Pre-excitation syndrome: Secondary | ICD-10-CM | POA: Diagnosis not present

## 2021-08-22 DIAGNOSIS — Z881 Allergy status to other antibiotic agents status: Secondary | ICD-10-CM | POA: Diagnosis not present

## 2021-08-22 DIAGNOSIS — Z79899 Other long term (current) drug therapy: Secondary | ICD-10-CM | POA: Diagnosis not present

## 2021-08-22 DIAGNOSIS — I951 Orthostatic hypotension: Secondary | ICD-10-CM | POA: Diagnosis not present

## 2021-08-22 DIAGNOSIS — R002 Palpitations: Secondary | ICD-10-CM | POA: Diagnosis not present

## 2021-08-22 DIAGNOSIS — I480 Paroxysmal atrial fibrillation: Secondary | ICD-10-CM | POA: Diagnosis not present

## 2021-08-22 DIAGNOSIS — K219 Gastro-esophageal reflux disease without esophagitis: Secondary | ICD-10-CM | POA: Diagnosis not present

## 2021-08-22 DIAGNOSIS — Z4509 Encounter for adjustment and management of other cardiac device: Secondary | ICD-10-CM | POA: Diagnosis not present

## 2021-08-26 DIAGNOSIS — R002 Palpitations: Secondary | ICD-10-CM | POA: Diagnosis not present

## 2021-08-26 DIAGNOSIS — Z4509 Encounter for adjustment and management of other cardiac device: Secondary | ICD-10-CM | POA: Diagnosis not present

## 2021-09-30 DIAGNOSIS — R002 Palpitations: Secondary | ICD-10-CM | POA: Diagnosis not present

## 2021-09-30 DIAGNOSIS — Z4509 Encounter for adjustment and management of other cardiac device: Secondary | ICD-10-CM | POA: Diagnosis not present

## 2021-10-09 DIAGNOSIS — E663 Overweight: Secondary | ICD-10-CM | POA: Diagnosis not present

## 2021-10-09 DIAGNOSIS — J019 Acute sinusitis, unspecified: Secondary | ICD-10-CM | POA: Diagnosis not present

## 2021-10-09 DIAGNOSIS — Z6825 Body mass index (BMI) 25.0-25.9, adult: Secondary | ICD-10-CM | POA: Diagnosis not present

## 2021-11-15 ENCOUNTER — Other Ambulatory Visit: Payer: Self-pay | Admitting: Internal Medicine

## 2021-11-15 ENCOUNTER — Other Ambulatory Visit (HOSPITAL_COMMUNITY): Payer: Self-pay | Admitting: Internal Medicine

## 2021-11-15 DIAGNOSIS — Z1331 Encounter for screening for depression: Secondary | ICD-10-CM | POA: Diagnosis not present

## 2021-11-15 DIAGNOSIS — I10 Essential (primary) hypertension: Secondary | ICD-10-CM | POA: Diagnosis not present

## 2021-11-15 DIAGNOSIS — E663 Overweight: Secondary | ICD-10-CM | POA: Diagnosis not present

## 2021-11-15 DIAGNOSIS — E782 Mixed hyperlipidemia: Secondary | ICD-10-CM | POA: Diagnosis not present

## 2021-11-15 DIAGNOSIS — D518 Other vitamin B12 deficiency anemias: Secondary | ICD-10-CM | POA: Diagnosis not present

## 2021-11-15 DIAGNOSIS — L931 Subacute cutaneous lupus erythematosus: Secondary | ICD-10-CM | POA: Diagnosis not present

## 2021-11-15 DIAGNOSIS — I493 Ventricular premature depolarization: Secondary | ICD-10-CM | POA: Diagnosis not present

## 2021-11-15 DIAGNOSIS — Z0001 Encounter for general adult medical examination with abnormal findings: Secondary | ICD-10-CM | POA: Diagnosis not present

## 2021-11-15 DIAGNOSIS — E559 Vitamin D deficiency, unspecified: Secondary | ICD-10-CM | POA: Diagnosis not present

## 2021-11-15 DIAGNOSIS — G450 Vertebro-basilar artery syndrome: Secondary | ICD-10-CM | POA: Diagnosis not present

## 2021-11-15 DIAGNOSIS — Z125 Encounter for screening for malignant neoplasm of prostate: Secondary | ICD-10-CM | POA: Diagnosis not present

## 2021-11-15 DIAGNOSIS — Z6825 Body mass index (BMI) 25.0-25.9, adult: Secondary | ICD-10-CM | POA: Diagnosis not present

## 2021-11-15 DIAGNOSIS — R7309 Other abnormal glucose: Secondary | ICD-10-CM | POA: Diagnosis not present

## 2021-11-25 DIAGNOSIS — I480 Paroxysmal atrial fibrillation: Secondary | ICD-10-CM | POA: Diagnosis not present

## 2021-11-25 DIAGNOSIS — Z4509 Encounter for adjustment and management of other cardiac device: Secondary | ICD-10-CM | POA: Diagnosis not present

## 2021-11-28 ENCOUNTER — Ambulatory Visit (HOSPITAL_COMMUNITY)
Admission: RE | Admit: 2021-11-28 | Discharge: 2021-11-28 | Disposition: A | Discharge status: Home / Self Care | Payer: Medicare HMO | Source: Ambulatory Visit | Attending: Internal Medicine | Admitting: Internal Medicine

## 2021-11-28 DIAGNOSIS — E782 Mixed hyperlipidemia: Secondary | ICD-10-CM | POA: Diagnosis not present

## 2021-11-28 DIAGNOSIS — I6523 Occlusion and stenosis of bilateral carotid arteries: Secondary | ICD-10-CM | POA: Diagnosis not present

## 2021-11-28 DIAGNOSIS — R42 Dizziness and giddiness: Secondary | ICD-10-CM | POA: Diagnosis not present

## 2021-11-28 DIAGNOSIS — I10 Essential (primary) hypertension: Secondary | ICD-10-CM | POA: Diagnosis not present

## 2021-11-28 DIAGNOSIS — G450 Vertebro-basilar artery syndrome: Secondary | ICD-10-CM

## 2021-12-09 DIAGNOSIS — K219 Gastro-esophageal reflux disease without esophagitis: Secondary | ICD-10-CM | POA: Diagnosis not present

## 2021-12-09 DIAGNOSIS — I10 Essential (primary) hypertension: Secondary | ICD-10-CM | POA: Diagnosis not present

## 2021-12-09 DIAGNOSIS — Z7982 Long term (current) use of aspirin: Secondary | ICD-10-CM | POA: Diagnosis not present

## 2021-12-09 DIAGNOSIS — I480 Paroxysmal atrial fibrillation: Secondary | ICD-10-CM | POA: Diagnosis not present

## 2021-12-09 DIAGNOSIS — Z79899 Other long term (current) drug therapy: Secondary | ICD-10-CM | POA: Diagnosis not present

## 2021-12-09 DIAGNOSIS — I2510 Atherosclerotic heart disease of native coronary artery without angina pectoris: Secondary | ICD-10-CM | POA: Diagnosis not present

## 2021-12-09 DIAGNOSIS — R002 Palpitations: Secondary | ICD-10-CM | POA: Diagnosis not present

## 2021-12-09 DIAGNOSIS — I951 Orthostatic hypotension: Secondary | ICD-10-CM | POA: Diagnosis not present

## 2021-12-09 DIAGNOSIS — Z87891 Personal history of nicotine dependence: Secondary | ICD-10-CM | POA: Diagnosis not present

## 2021-12-09 DIAGNOSIS — Z885 Allergy status to narcotic agent status: Secondary | ICD-10-CM | POA: Diagnosis not present

## 2021-12-09 DIAGNOSIS — Z888 Allergy status to other drugs, medicaments and biological substances status: Secondary | ICD-10-CM | POA: Diagnosis not present

## 2021-12-09 DIAGNOSIS — Z881 Allergy status to other antibiotic agents status: Secondary | ICD-10-CM | POA: Diagnosis not present

## 2021-12-09 DIAGNOSIS — R001 Bradycardia, unspecified: Secondary | ICD-10-CM | POA: Diagnosis not present

## 2021-12-22 DIAGNOSIS — G4733 Obstructive sleep apnea (adult) (pediatric): Secondary | ICD-10-CM | POA: Diagnosis not present

## 2021-12-25 DIAGNOSIS — L931 Subacute cutaneous lupus erythematosus: Secondary | ICD-10-CM | POA: Diagnosis not present

## 2021-12-25 DIAGNOSIS — Z6826 Body mass index (BMI) 26.0-26.9, adult: Secondary | ICD-10-CM | POA: Diagnosis not present

## 2021-12-25 DIAGNOSIS — I480 Paroxysmal atrial fibrillation: Secondary | ICD-10-CM | POA: Diagnosis not present

## 2021-12-25 DIAGNOSIS — I10 Essential (primary) hypertension: Secondary | ICD-10-CM | POA: Diagnosis not present

## 2021-12-27 ENCOUNTER — Telehealth: Payer: Self-pay | Admitting: *Deleted

## 2021-12-27 NOTE — Patient Outreach (Signed)
  Care Coordination   12/27/2021 Name: Peter Flores MRN: [id number] DOB: [birth year]   Care Coordination Outreach Attempts:  An unsuccessful telephone outreach was attempted today to offer the patient information about available care coordination services as a benefit of their health plan.   Follow Up Plan:  Additional outreach attempts will be made to offer the patient care coordination information and services.   Encounter Outcome:  No Answer  Care Coordination Interventions Activated:  No   Care Coordination Interventions:  No, not indicated    Chong Sicilian, BSN, RN-BC RN Care Coordinator Direct Dial: [phone]

## 2022-01-02 DIAGNOSIS — L931 Subacute cutaneous lupus erythematosus: Secondary | ICD-10-CM | POA: Diagnosis not present

## 2022-01-02 DIAGNOSIS — L821 Other seborrheic keratosis: Secondary | ICD-10-CM | POA: Diagnosis not present

## 2022-01-17 ENCOUNTER — Other Ambulatory Visit: Payer: Self-pay | Admitting: *Deleted

## 2022-01-17 NOTE — Patient Outreach (Signed)
  Care Coordination   01/17/2022 Name: Peter Flores MRN: [id number] DOB: 11/16/1955   Care Coordination Outreach Attempts:  Contact was made with the patient today to offer care coordination services as a benefit of their health plan. The patient requested a return call on a later date.   Follow Up Plan:  Additional outreach attempts will be made to offer the patient care coordination information and services.   Encounter Outcome:  Pt. Request to Call Back  Care Coordination Interventions Activated:  No   Care Coordination Interventions:  No, not indicated    Valente David, RN, MSN, [facility] Care Management Care Management Coordinator [phone]

## 2022-01-22 ENCOUNTER — Ambulatory Visit: Payer: Self-pay | Admitting: *Deleted

## 2022-01-22 NOTE — Patient Outreach (Signed)
  Care Coordination   Initial Visit Note   01/22/2022 Name: Peter Flores MRN: [id number] DOB: [birth year]  Peter Flores is a 66 y.o. year old male who sees Sharilyn Sites, MD for primary care. I spoke with  Peter Flores by phone today.  What matters to the patients health and wellness today?  Call was successful, however member state not a good time to talk and will call back.  This is 3rd attempt at engagement, will open case if member call back.      SDOH assessments and interventions completed:  No     Care Coordination Interventions Activated:  No  Care Coordination Interventions:  No, not indicated   Follow up plan:  Will open case only if member calls back with interest.     Encounter Outcome:  Pt. Request to Call Felicity, RN, MSN, [place] Management Care Management Coordinator [phone]

## 2022-02-03 DIAGNOSIS — I480 Paroxysmal atrial fibrillation: Secondary | ICD-10-CM | POA: Diagnosis not present

## 2022-02-05 DIAGNOSIS — Z87891 Personal history of nicotine dependence: Secondary | ICD-10-CM | POA: Diagnosis not present

## 2022-02-05 DIAGNOSIS — I2510 Atherosclerotic heart disease of native coronary artery without angina pectoris: Secondary | ICD-10-CM | POA: Diagnosis not present

## 2022-02-05 DIAGNOSIS — I480 Paroxysmal atrial fibrillation: Secondary | ICD-10-CM | POA: Diagnosis not present

## 2022-02-05 DIAGNOSIS — I10 Essential (primary) hypertension: Secondary | ICD-10-CM | POA: Diagnosis not present

## 2022-02-05 DIAGNOSIS — Z7901 Long term (current) use of anticoagulants: Secondary | ICD-10-CM | POA: Diagnosis not present

## 2022-02-05 DIAGNOSIS — R001 Bradycardia, unspecified: Secondary | ICD-10-CM | POA: Diagnosis not present

## 2022-02-05 DIAGNOSIS — Z6826 Body mass index (BMI) 26.0-26.9, adult: Secondary | ICD-10-CM | POA: Diagnosis not present

## 2022-02-09 DIAGNOSIS — R079 Chest pain, unspecified: Secondary | ICD-10-CM | POA: Diagnosis not present

## 2022-02-09 DIAGNOSIS — I4891 Unspecified atrial fibrillation: Secondary | ICD-10-CM | POA: Diagnosis not present

## 2022-02-09 DIAGNOSIS — R002 Palpitations: Secondary | ICD-10-CM | POA: Diagnosis not present

## 2022-02-09 DIAGNOSIS — Z885 Allergy status to narcotic agent status: Secondary | ICD-10-CM | POA: Diagnosis not present

## 2022-02-09 DIAGNOSIS — Z6825 Body mass index (BMI) 25.0-25.9, adult: Secondary | ICD-10-CM | POA: Diagnosis not present

## 2022-02-09 DIAGNOSIS — Z7901 Long term (current) use of anticoagulants: Secondary | ICD-10-CM | POA: Diagnosis not present

## 2022-02-09 DIAGNOSIS — R001 Bradycardia, unspecified: Secondary | ICD-10-CM | POA: Diagnosis not present

## 2022-02-09 DIAGNOSIS — R0789 Other chest pain: Secondary | ICD-10-CM | POA: Diagnosis not present

## 2022-02-09 DIAGNOSIS — Z87891 Personal history of nicotine dependence: Secondary | ICD-10-CM | POA: Diagnosis not present

## 2022-02-09 DIAGNOSIS — Z888 Allergy status to other drugs, medicaments and biological substances status: Secondary | ICD-10-CM | POA: Diagnosis not present

## 2022-02-09 IMAGING — CR DG CHEST 2V
2 series · 2 of 2 positions shown · non-contrast
Comparison: Radiograph 08/09/2014

CLINICAL DATA: Left-sided chest pain.

EXAM:
CHEST - 2 VIEW

[chest pa]
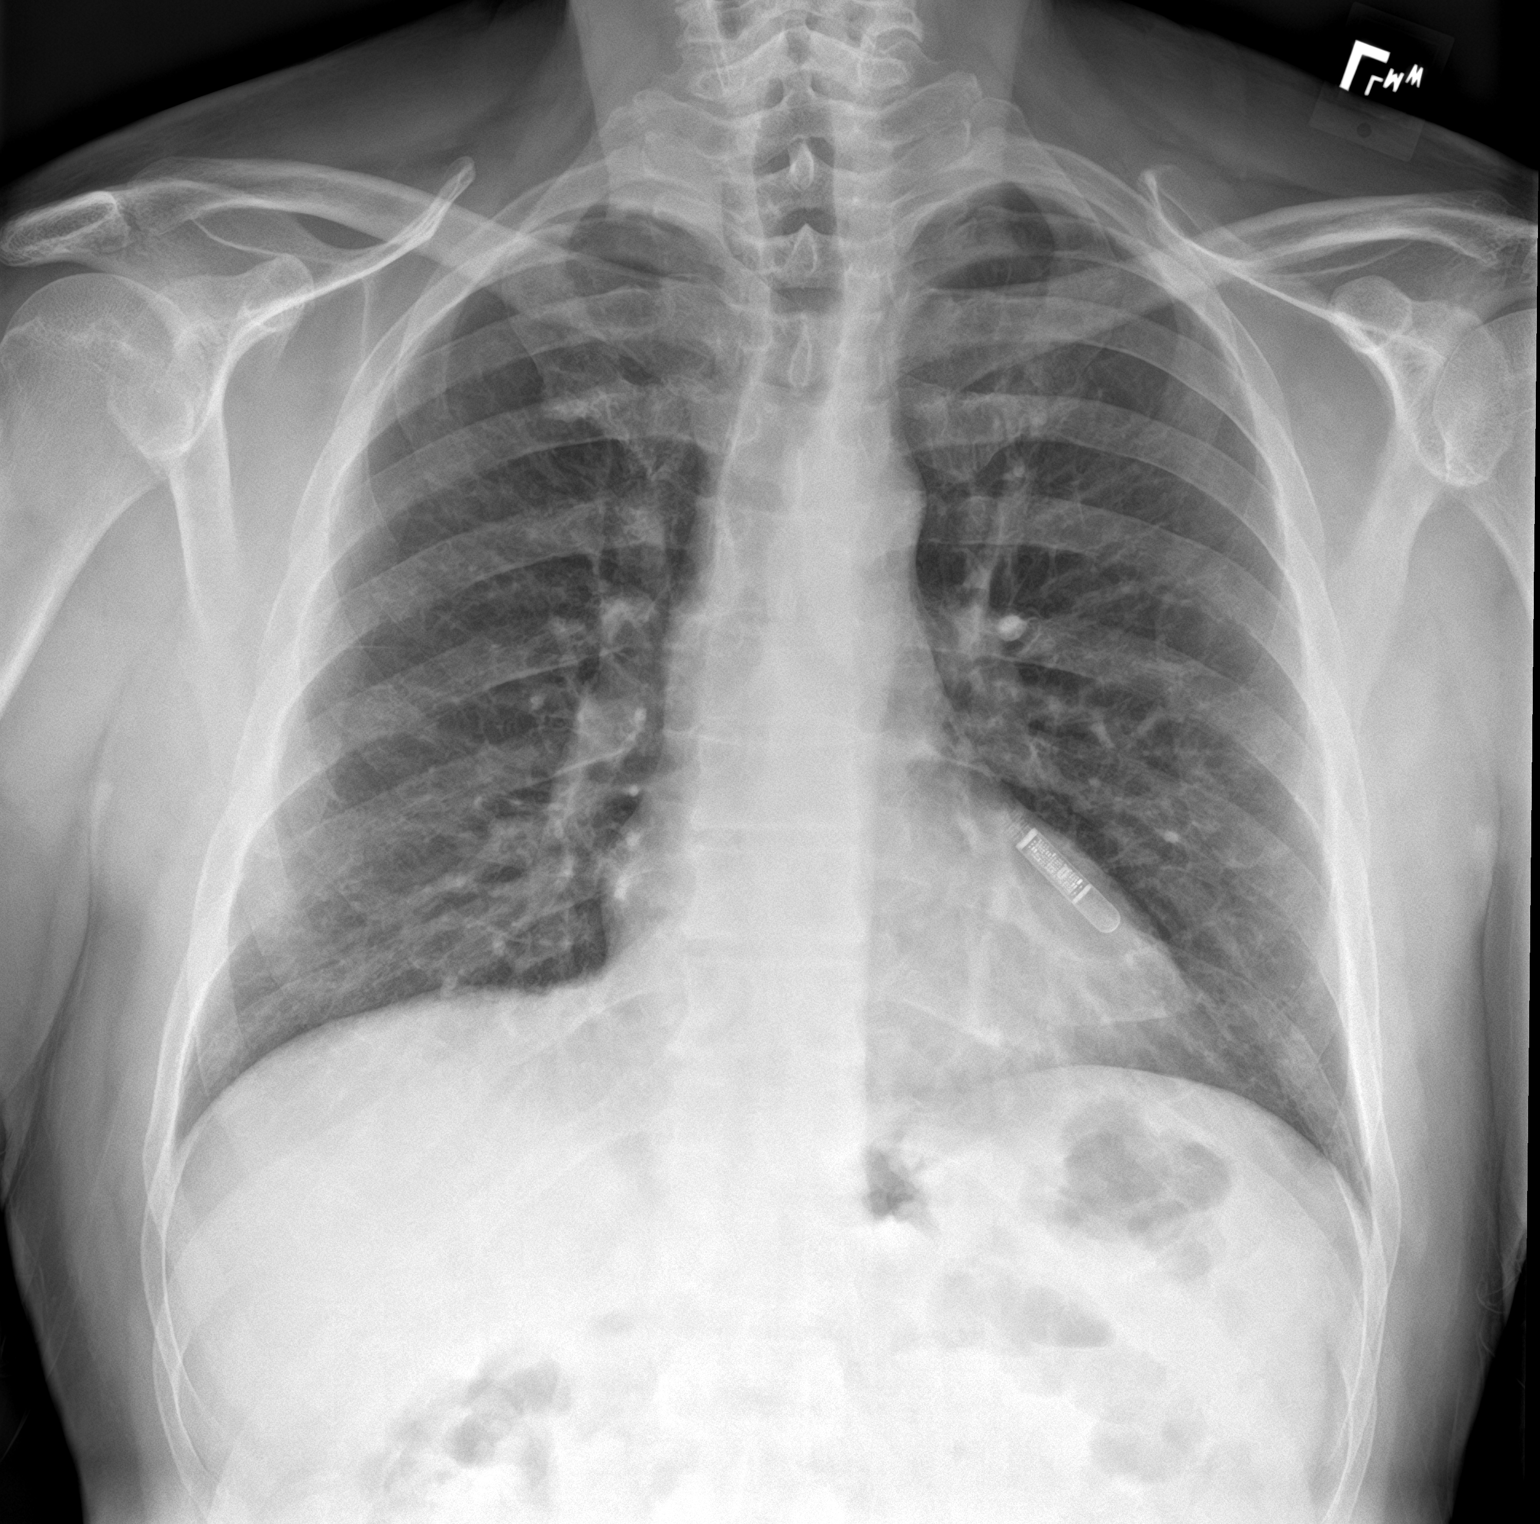

[chest lat]
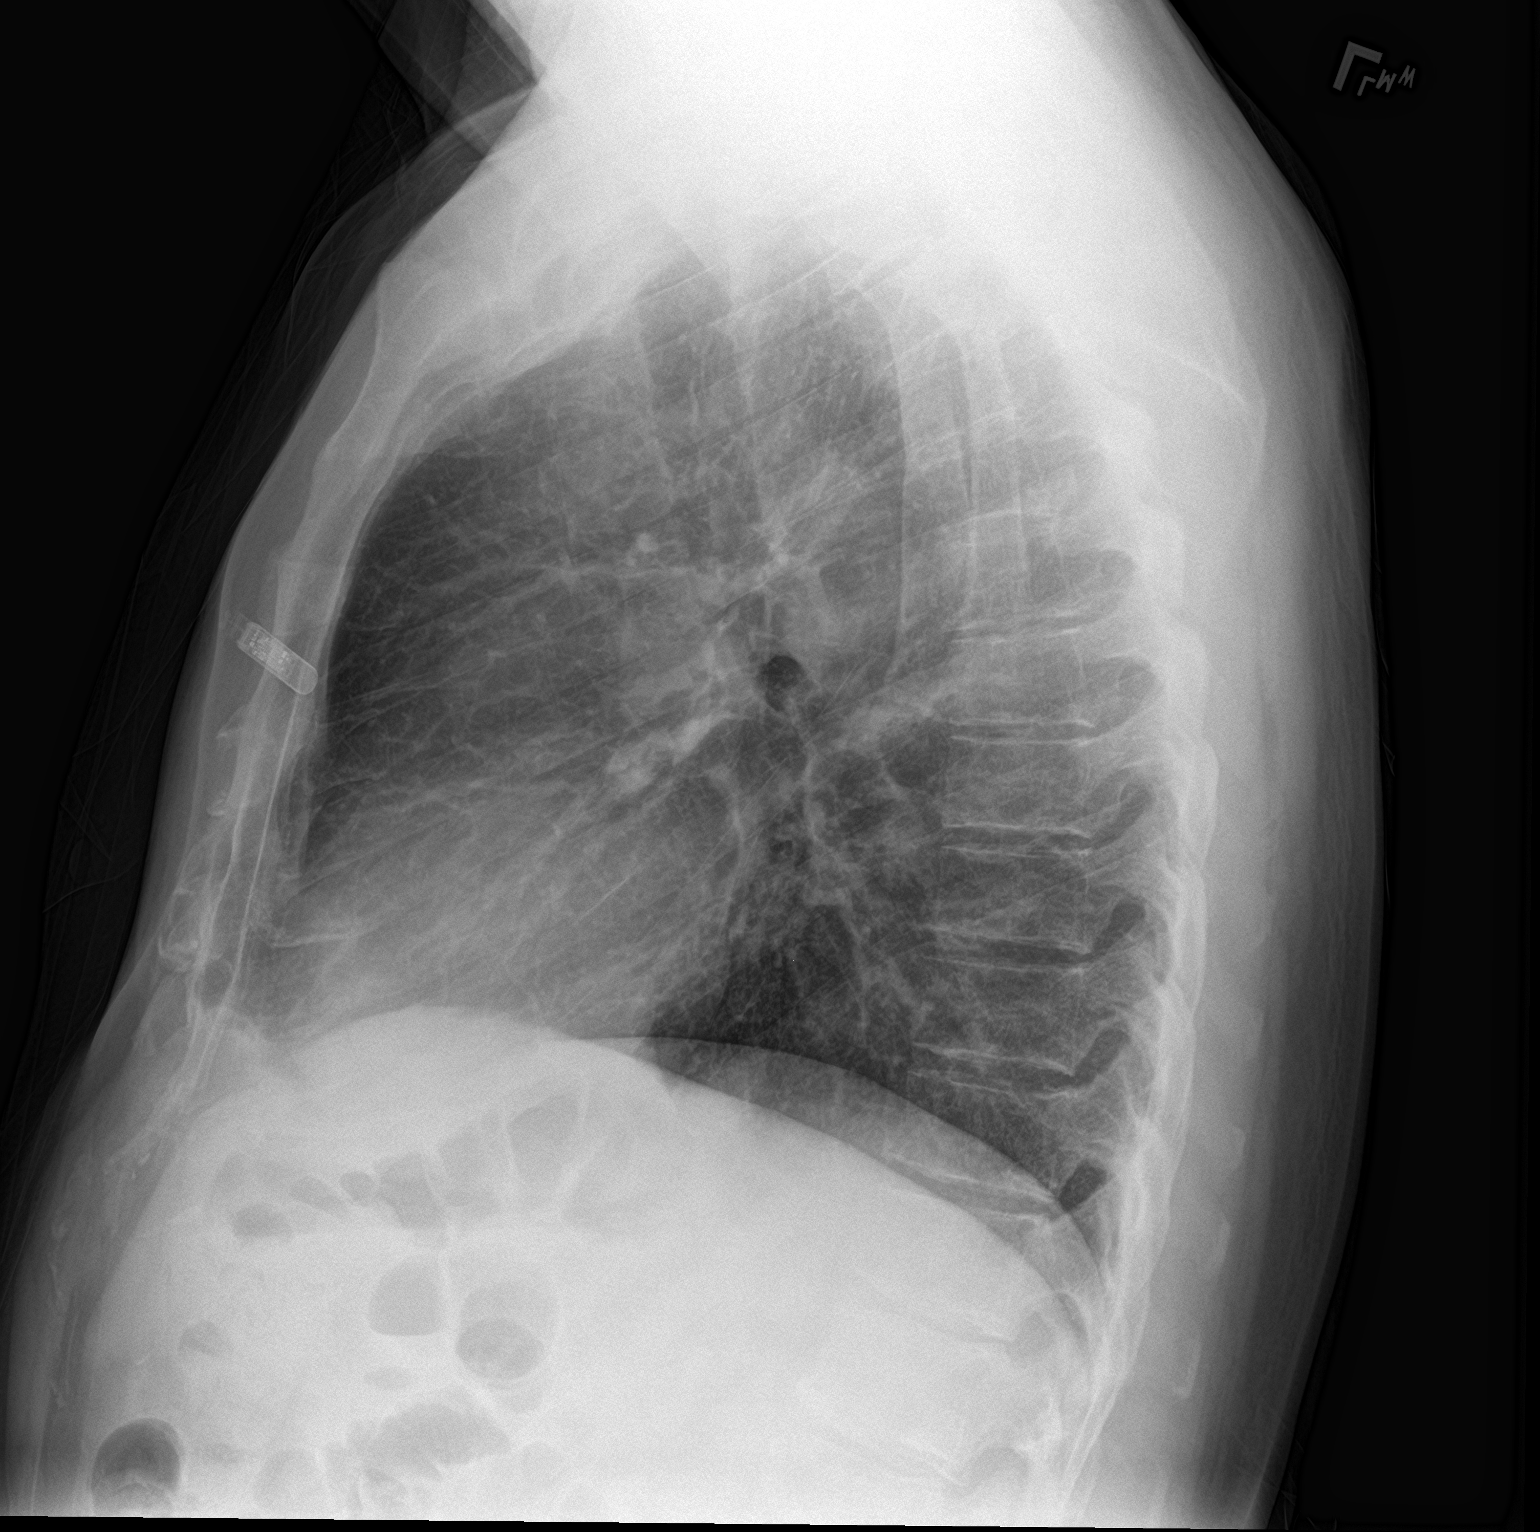

[2 of 2 positions shown; findings below may reference images not displayed]

FINDINGS: The cardiomediastinal contours are normal. Implanted loop recorder
projects over the left chest wall. Pulmonary vasculature is normal.
No consolidation, pleural effusion, or pneumothorax. No acute
osseous abnormalities are seen.
IMPRESSION: No acute chest findings.

## 2022-02-10 DIAGNOSIS — I480 Paroxysmal atrial fibrillation: Secondary | ICD-10-CM | POA: Diagnosis not present

## 2022-02-10 DIAGNOSIS — R079 Chest pain, unspecified: Secondary | ICD-10-CM | POA: Diagnosis not present

## 2022-02-14 DIAGNOSIS — I491 Atrial premature depolarization: Secondary | ICD-10-CM | POA: Diagnosis not present

## 2022-02-14 DIAGNOSIS — Z885 Allergy status to narcotic agent status: Secondary | ICD-10-CM | POA: Diagnosis not present

## 2022-02-14 DIAGNOSIS — Z6826 Body mass index (BMI) 26.0-26.9, adult: Secondary | ICD-10-CM | POA: Diagnosis not present

## 2022-02-14 DIAGNOSIS — K219 Gastro-esophageal reflux disease without esophagitis: Secondary | ICD-10-CM | POA: Diagnosis not present

## 2022-02-14 DIAGNOSIS — I4891 Unspecified atrial fibrillation: Secondary | ICD-10-CM | POA: Diagnosis not present

## 2022-02-14 DIAGNOSIS — Z888 Allergy status to other drugs, medicaments and biological substances status: Secondary | ICD-10-CM | POA: Diagnosis not present

## 2022-02-14 DIAGNOSIS — R778 Other specified abnormalities of plasma proteins: Secondary | ICD-10-CM | POA: Diagnosis not present

## 2022-02-14 DIAGNOSIS — Z7901 Long term (current) use of anticoagulants: Secondary | ICD-10-CM | POA: Diagnosis not present

## 2022-02-14 DIAGNOSIS — I2510 Atherosclerotic heart disease of native coronary artery without angina pectoris: Secondary | ICD-10-CM | POA: Diagnosis not present

## 2022-02-14 DIAGNOSIS — I4510 Unspecified right bundle-branch block: Secondary | ICD-10-CM | POA: Diagnosis not present

## 2022-02-14 DIAGNOSIS — I10 Essential (primary) hypertension: Secondary | ICD-10-CM | POA: Diagnosis not present

## 2022-02-14 DIAGNOSIS — Z87891 Personal history of nicotine dependence: Secondary | ICD-10-CM | POA: Diagnosis not present

## 2022-02-14 DIAGNOSIS — L932 Other local lupus erythematosus: Secondary | ICD-10-CM | POA: Diagnosis not present

## 2022-02-14 DIAGNOSIS — Z881 Allergy status to other antibiotic agents status: Secondary | ICD-10-CM | POA: Diagnosis not present

## 2022-02-14 DIAGNOSIS — Z79899 Other long term (current) drug therapy: Secondary | ICD-10-CM | POA: Diagnosis not present

## 2022-02-14 DIAGNOSIS — R0789 Other chest pain: Secondary | ICD-10-CM | POA: Diagnosis not present

## 2022-02-14 DIAGNOSIS — I480 Paroxysmal atrial fibrillation: Secondary | ICD-10-CM | POA: Diagnosis not present

## 2022-02-14 DIAGNOSIS — I493 Ventricular premature depolarization: Secondary | ICD-10-CM | POA: Diagnosis not present

## 2022-02-14 DIAGNOSIS — R079 Chest pain, unspecified: Secondary | ICD-10-CM | POA: Diagnosis not present

## 2022-02-14 DIAGNOSIS — R002 Palpitations: Secondary | ICD-10-CM | POA: Diagnosis not present

## 2022-02-15 DIAGNOSIS — I10 Essential (primary) hypertension: Secondary | ICD-10-CM | POA: Diagnosis not present

## 2022-02-15 DIAGNOSIS — R079 Chest pain, unspecified: Secondary | ICD-10-CM | POA: Diagnosis not present

## 2022-02-15 DIAGNOSIS — I480 Paroxysmal atrial fibrillation: Secondary | ICD-10-CM | POA: Diagnosis not present

## 2022-02-20 DIAGNOSIS — I10 Essential (primary) hypertension: Secondary | ICD-10-CM | POA: Diagnosis not present

## 2022-02-20 DIAGNOSIS — I340 Nonrheumatic mitral (valve) insufficiency: Secondary | ICD-10-CM | POA: Diagnosis not present

## 2022-02-20 DIAGNOSIS — I480 Paroxysmal atrial fibrillation: Secondary | ICD-10-CM | POA: Diagnosis not present

## 2022-02-20 DIAGNOSIS — R42 Dizziness and giddiness: Secondary | ICD-10-CM | POA: Diagnosis not present

## 2022-02-20 DIAGNOSIS — I2510 Atherosclerotic heart disease of native coronary artery without angina pectoris: Secondary | ICD-10-CM | POA: Diagnosis not present

## 2022-02-20 DIAGNOSIS — R002 Palpitations: Secondary | ICD-10-CM | POA: Diagnosis not present

## 2022-02-20 DIAGNOSIS — Z7901 Long term (current) use of anticoagulants: Secondary | ICD-10-CM | POA: Diagnosis not present

## 2022-02-20 DIAGNOSIS — I4891 Unspecified atrial fibrillation: Secondary | ICD-10-CM | POA: Diagnosis not present

## 2022-02-20 DIAGNOSIS — Z87891 Personal history of nicotine dependence: Secondary | ICD-10-CM | POA: Diagnosis not present

## 2022-02-21 DIAGNOSIS — I480 Paroxysmal atrial fibrillation: Secondary | ICD-10-CM | POA: Diagnosis not present

## 2022-02-23 DIAGNOSIS — I480 Paroxysmal atrial fibrillation: Secondary | ICD-10-CM | POA: Diagnosis not present

## 2022-02-25 DIAGNOSIS — I480 Paroxysmal atrial fibrillation: Secondary | ICD-10-CM | POA: Diagnosis not present

## 2022-03-05 DIAGNOSIS — Z6826 Body mass index (BMI) 26.0-26.9, adult: Secondary | ICD-10-CM | POA: Diagnosis not present

## 2022-03-05 DIAGNOSIS — I480 Paroxysmal atrial fibrillation: Secondary | ICD-10-CM | POA: Diagnosis not present

## 2022-03-06 DIAGNOSIS — G4733 Obstructive sleep apnea (adult) (pediatric): Secondary | ICD-10-CM | POA: Diagnosis not present

## 2022-03-15 DIAGNOSIS — I480 Paroxysmal atrial fibrillation: Secondary | ICD-10-CM | POA: Diagnosis not present

## 2022-03-20 DIAGNOSIS — G4733 Obstructive sleep apnea (adult) (pediatric): Secondary | ICD-10-CM | POA: Diagnosis not present

## 2022-05-05 DIAGNOSIS — R42 Dizziness and giddiness: Secondary | ICD-10-CM | POA: Diagnosis not present

## 2022-05-05 DIAGNOSIS — R002 Palpitations: Secondary | ICD-10-CM | POA: Diagnosis not present

## 2022-05-05 DIAGNOSIS — Z87891 Personal history of nicotine dependence: Secondary | ICD-10-CM | POA: Diagnosis not present

## 2022-05-05 DIAGNOSIS — I10 Essential (primary) hypertension: Secondary | ICD-10-CM | POA: Diagnosis not present

## 2022-05-05 DIAGNOSIS — E785 Hyperlipidemia, unspecified: Secondary | ICD-10-CM | POA: Diagnosis not present

## 2022-05-05 DIAGNOSIS — I2510 Atherosclerotic heart disease of native coronary artery without angina pectoris: Secondary | ICD-10-CM | POA: Diagnosis not present

## 2022-05-05 DIAGNOSIS — I493 Ventricular premature depolarization: Secondary | ICD-10-CM | POA: Diagnosis not present

## 2022-05-05 DIAGNOSIS — I480 Paroxysmal atrial fibrillation: Secondary | ICD-10-CM | POA: Diagnosis not present

## 2022-05-05 DIAGNOSIS — I491 Atrial premature depolarization: Secondary | ICD-10-CM | POA: Diagnosis not present

## 2022-05-05 DIAGNOSIS — Z7901 Long term (current) use of anticoagulants: Secondary | ICD-10-CM | POA: Diagnosis not present

## 2022-05-09 DIAGNOSIS — I480 Paroxysmal atrial fibrillation: Secondary | ICD-10-CM | POA: Diagnosis not present

## 2022-05-09 DIAGNOSIS — I493 Ventricular premature depolarization: Secondary | ICD-10-CM | POA: Diagnosis not present

## 2022-05-14 DIAGNOSIS — Z20828 Contact with and (suspected) exposure to other viral communicable diseases: Secondary | ICD-10-CM | POA: Diagnosis not present

## 2022-05-14 DIAGNOSIS — E663 Overweight: Secondary | ICD-10-CM | POA: Diagnosis not present

## 2022-05-14 DIAGNOSIS — I10 Essential (primary) hypertension: Secondary | ICD-10-CM | POA: Diagnosis not present

## 2022-05-14 DIAGNOSIS — I493 Ventricular premature depolarization: Secondary | ICD-10-CM | POA: Diagnosis not present

## 2022-05-14 DIAGNOSIS — G4733 Obstructive sleep apnea (adult) (pediatric): Secondary | ICD-10-CM | POA: Diagnosis not present

## 2022-05-14 DIAGNOSIS — R002 Palpitations: Secondary | ICD-10-CM | POA: Diagnosis not present

## 2022-05-14 DIAGNOSIS — Z6825 Body mass index (BMI) 25.0-25.9, adult: Secondary | ICD-10-CM | POA: Diagnosis not present

## 2022-05-14 DIAGNOSIS — J0100 Acute maxillary sinusitis, unspecified: Secondary | ICD-10-CM | POA: Diagnosis not present

## 2022-05-21 DIAGNOSIS — I493 Ventricular premature depolarization: Secondary | ICD-10-CM | POA: Diagnosis not present

## 2022-05-21 DIAGNOSIS — I480 Paroxysmal atrial fibrillation: Secondary | ICD-10-CM | POA: Diagnosis not present

## 2022-06-11 DIAGNOSIS — Z881 Allergy status to other antibiotic agents status: Secondary | ICD-10-CM | POA: Diagnosis not present

## 2022-06-11 DIAGNOSIS — G4733 Obstructive sleep apnea (adult) (pediatric): Secondary | ICD-10-CM | POA: Diagnosis not present

## 2022-06-11 DIAGNOSIS — Z87891 Personal history of nicotine dependence: Secondary | ICD-10-CM | POA: Diagnosis not present

## 2022-06-11 DIAGNOSIS — E785 Hyperlipidemia, unspecified: Secondary | ICD-10-CM | POA: Diagnosis not present

## 2022-06-11 DIAGNOSIS — Z79899 Other long term (current) drug therapy: Secondary | ICD-10-CM | POA: Diagnosis not present

## 2022-06-11 DIAGNOSIS — I2510 Atherosclerotic heart disease of native coronary artery without angina pectoris: Secondary | ICD-10-CM | POA: Diagnosis not present

## 2022-06-11 DIAGNOSIS — Z885 Allergy status to narcotic agent status: Secondary | ICD-10-CM | POA: Diagnosis not present

## 2022-06-11 DIAGNOSIS — Z8249 Family history of ischemic heart disease and other diseases of the circulatory system: Secondary | ICD-10-CM | POA: Diagnosis not present

## 2022-06-11 DIAGNOSIS — I480 Paroxysmal atrial fibrillation: Secondary | ICD-10-CM | POA: Diagnosis not present

## 2022-06-11 DIAGNOSIS — Z7901 Long term (current) use of anticoagulants: Secondary | ICD-10-CM | POA: Diagnosis not present

## 2022-06-11 DIAGNOSIS — I10 Essential (primary) hypertension: Secondary | ICD-10-CM | POA: Diagnosis not present

## 2022-06-11 DIAGNOSIS — I493 Ventricular premature depolarization: Secondary | ICD-10-CM | POA: Diagnosis not present

## 2022-06-11 DIAGNOSIS — Z888 Allergy status to other drugs, medicaments and biological substances status: Secondary | ICD-10-CM | POA: Diagnosis not present

## 2022-06-11 DIAGNOSIS — L931 Subacute cutaneous lupus erythematosus: Secondary | ICD-10-CM | POA: Diagnosis not present

## 2022-06-12 DIAGNOSIS — I480 Paroxysmal atrial fibrillation: Secondary | ICD-10-CM | POA: Diagnosis not present

## 2022-06-17 DIAGNOSIS — I493 Ventricular premature depolarization: Secondary | ICD-10-CM | POA: Diagnosis not present

## 2022-06-17 DIAGNOSIS — I480 Paroxysmal atrial fibrillation: Secondary | ICD-10-CM | POA: Diagnosis not present

## 2022-06-27 ENCOUNTER — Ambulatory Visit (HOSPITAL_COMMUNITY)
Admission: RE | Admit: 2022-06-27 | Discharge: 2022-06-27 | Disposition: A | Discharge status: Home / Self Care | Payer: Medicare HMO | Source: Ambulatory Visit | Attending: Internal Medicine | Admitting: Internal Medicine

## 2022-06-27 ENCOUNTER — Other Ambulatory Visit (HOSPITAL_COMMUNITY): Payer: Self-pay | Admitting: Internal Medicine

## 2022-06-27 ENCOUNTER — Encounter (HOSPITAL_COMMUNITY): Payer: Self-pay | Admitting: Internal Medicine

## 2022-06-27 DIAGNOSIS — M79671 Pain in right foot: Secondary | ICD-10-CM

## 2022-06-27 DIAGNOSIS — E119 Type 2 diabetes mellitus without complications: Secondary | ICD-10-CM | POA: Diagnosis not present

## 2022-06-27 DIAGNOSIS — Z6825 Body mass index (BMI) 25.0-25.9, adult: Secondary | ICD-10-CM | POA: Diagnosis not present

## 2022-06-27 DIAGNOSIS — R350 Frequency of micturition: Secondary | ICD-10-CM | POA: Diagnosis not present

## 2022-06-27 DIAGNOSIS — T50905A Adverse effect of unspecified drugs, medicaments and biological substances, initial encounter: Secondary | ICD-10-CM | POA: Diagnosis not present

## 2022-06-27 DIAGNOSIS — G4733 Obstructive sleep apnea (adult) (pediatric): Secondary | ICD-10-CM | POA: Diagnosis not present

## 2022-06-27 DIAGNOSIS — I10 Essential (primary) hypertension: Secondary | ICD-10-CM | POA: Diagnosis not present

## 2022-06-27 DIAGNOSIS — R002 Palpitations: Secondary | ICD-10-CM | POA: Diagnosis not present

## 2022-06-27 DIAGNOSIS — M7731 Calcaneal spur, right foot: Secondary | ICD-10-CM | POA: Diagnosis not present

## 2022-06-27 DIAGNOSIS — E663 Overweight: Secondary | ICD-10-CM | POA: Diagnosis not present

## 2022-07-07 DIAGNOSIS — I10 Essential (primary) hypertension: Secondary | ICD-10-CM | POA: Diagnosis not present

## 2022-07-09 DIAGNOSIS — I480 Paroxysmal atrial fibrillation: Secondary | ICD-10-CM | POA: Diagnosis not present

## 2022-07-09 DIAGNOSIS — I2510 Atherosclerotic heart disease of native coronary artery without angina pectoris: Secondary | ICD-10-CM | POA: Diagnosis not present

## 2022-07-09 DIAGNOSIS — I456 Pre-excitation syndrome: Secondary | ICD-10-CM | POA: Diagnosis not present

## 2022-07-09 DIAGNOSIS — Z79899 Other long term (current) drug therapy: Secondary | ICD-10-CM | POA: Diagnosis not present

## 2022-07-09 DIAGNOSIS — Z7901 Long term (current) use of anticoagulants: Secondary | ICD-10-CM | POA: Diagnosis not present

## 2022-07-09 DIAGNOSIS — I4510 Unspecified right bundle-branch block: Secondary | ICD-10-CM | POA: Diagnosis not present

## 2022-07-09 DIAGNOSIS — I10 Essential (primary) hypertension: Secondary | ICD-10-CM | POA: Diagnosis not present

## 2022-07-12 DIAGNOSIS — I456 Pre-excitation syndrome: Secondary | ICD-10-CM | POA: Diagnosis not present

## 2022-07-12 DIAGNOSIS — I480 Paroxysmal atrial fibrillation: Secondary | ICD-10-CM | POA: Diagnosis not present

## 2022-07-31 DIAGNOSIS — M79671 Pain in right foot: Secondary | ICD-10-CM | POA: Diagnosis not present

## 2022-07-31 DIAGNOSIS — M722 Plantar fascial fibromatosis: Secondary | ICD-10-CM | POA: Diagnosis not present

## 2022-09-03 DIAGNOSIS — I493 Ventricular premature depolarization: Secondary | ICD-10-CM | POA: Diagnosis not present

## 2022-09-03 DIAGNOSIS — R5383 Other fatigue: Secondary | ICD-10-CM | POA: Diagnosis not present

## 2022-09-03 DIAGNOSIS — E785 Hyperlipidemia, unspecified: Secondary | ICD-10-CM | POA: Diagnosis not present

## 2022-09-03 DIAGNOSIS — Z87891 Personal history of nicotine dependence: Secondary | ICD-10-CM | POA: Diagnosis not present

## 2022-09-03 DIAGNOSIS — R001 Bradycardia, unspecified: Secondary | ICD-10-CM | POA: Diagnosis not present

## 2022-09-03 DIAGNOSIS — I480 Paroxysmal atrial fibrillation: Secondary | ICD-10-CM | POA: Diagnosis not present

## 2022-09-03 DIAGNOSIS — I2510 Atherosclerotic heart disease of native coronary artery without angina pectoris: Secondary | ICD-10-CM | POA: Diagnosis not present

## 2022-09-03 DIAGNOSIS — I119 Hypertensive heart disease without heart failure: Secondary | ICD-10-CM | POA: Diagnosis not present

## 2022-09-03 DIAGNOSIS — I456 Pre-excitation syndrome: Secondary | ICD-10-CM | POA: Diagnosis not present

## 2022-09-03 DIAGNOSIS — Z79899 Other long term (current) drug therapy: Secondary | ICD-10-CM | POA: Diagnosis not present

## 2022-09-03 DIAGNOSIS — Z7901 Long term (current) use of anticoagulants: Secondary | ICD-10-CM | POA: Diagnosis not present

## 2022-09-04 DIAGNOSIS — I456 Pre-excitation syndrome: Secondary | ICD-10-CM | POA: Diagnosis not present

## 2022-09-04 DIAGNOSIS — M79671 Pain in right foot: Secondary | ICD-10-CM | POA: Diagnosis not present

## 2022-09-04 DIAGNOSIS — M722 Plantar fascial fibromatosis: Secondary | ICD-10-CM | POA: Diagnosis not present

## 2022-10-09 DIAGNOSIS — M79671 Pain in right foot: Secondary | ICD-10-CM | POA: Diagnosis not present

## 2022-10-09 DIAGNOSIS — M722 Plantar fascial fibromatosis: Secondary | ICD-10-CM | POA: Diagnosis not present

## 2022-10-17 ENCOUNTER — Encounter (INDEPENDENT_AMBULATORY_CARE_PROVIDER_SITE_OTHER): Payer: Self-pay | Admitting: *Deleted

## 2022-11-25 DIAGNOSIS — Z0001 Encounter for general adult medical examination with abnormal findings: Secondary | ICD-10-CM | POA: Diagnosis not present

## 2022-11-25 DIAGNOSIS — Z125 Encounter for screening for malignant neoplasm of prostate: Secondary | ICD-10-CM | POA: Diagnosis not present

## 2022-11-25 DIAGNOSIS — G450 Vertebro-basilar artery syndrome: Secondary | ICD-10-CM | POA: Diagnosis not present

## 2022-11-25 DIAGNOSIS — Z1331 Encounter for screening for depression: Secondary | ICD-10-CM | POA: Diagnosis not present

## 2022-11-25 DIAGNOSIS — E559 Vitamin D deficiency, unspecified: Secondary | ICD-10-CM | POA: Diagnosis not present

## 2022-11-25 DIAGNOSIS — D518 Other vitamin B12 deficiency anemias: Secondary | ICD-10-CM | POA: Diagnosis not present

## 2022-11-25 DIAGNOSIS — E663 Overweight: Secondary | ICD-10-CM | POA: Diagnosis not present

## 2022-11-25 DIAGNOSIS — N401 Enlarged prostate with lower urinary tract symptoms: Secondary | ICD-10-CM | POA: Diagnosis not present

## 2022-11-25 DIAGNOSIS — I10 Essential (primary) hypertension: Secondary | ICD-10-CM | POA: Diagnosis not present

## 2022-11-25 DIAGNOSIS — R002 Palpitations: Secondary | ICD-10-CM | POA: Diagnosis not present

## 2022-11-25 DIAGNOSIS — N390 Urinary tract infection, site not specified: Secondary | ICD-10-CM | POA: Diagnosis not present

## 2022-11-25 DIAGNOSIS — I493 Ventricular premature depolarization: Secondary | ICD-10-CM | POA: Diagnosis not present

## 2022-11-25 DIAGNOSIS — G4733 Obstructive sleep apnea (adult) (pediatric): Secondary | ICD-10-CM | POA: Diagnosis not present

## 2022-11-25 DIAGNOSIS — Z6825 Body mass index (BMI) 25.0-25.9, adult: Secondary | ICD-10-CM | POA: Diagnosis not present

## 2022-11-25 DIAGNOSIS — E7849 Other hyperlipidemia: Secondary | ICD-10-CM | POA: Diagnosis not present

## 2022-12-09 DIAGNOSIS — Z6825 Body mass index (BMI) 25.0-25.9, adult: Secondary | ICD-10-CM | POA: Diagnosis not present

## 2022-12-09 DIAGNOSIS — K219 Gastro-esophageal reflux disease without esophagitis: Secondary | ICD-10-CM | POA: Diagnosis not present

## 2022-12-09 DIAGNOSIS — K2980 Duodenitis without bleeding: Secondary | ICD-10-CM | POA: Diagnosis not present

## 2022-12-09 DIAGNOSIS — I10 Essential (primary) hypertension: Secondary | ICD-10-CM | POA: Diagnosis not present

## 2022-12-09 DIAGNOSIS — B9681 Helicobacter pylori [H. pylori] as the cause of diseases classified elsewhere: Secondary | ICD-10-CM | POA: Diagnosis not present

## 2022-12-11 DIAGNOSIS — M79671 Pain in right foot: Secondary | ICD-10-CM | POA: Diagnosis not present

## 2022-12-11 DIAGNOSIS — M722 Plantar fascial fibromatosis: Secondary | ICD-10-CM | POA: Diagnosis not present

## 2022-12-15 ENCOUNTER — Ambulatory Visit
Admission: EM | Admit: 2022-12-15 | Discharge: 2022-12-15 | Disposition: A | Discharge status: Home / Self Care | Payer: Medicare HMO | Attending: Emergency Medicine | Admitting: Emergency Medicine

## 2022-12-15 ENCOUNTER — Telehealth: Payer: Self-pay | Admitting: Emergency Medicine

## 2022-12-15 DIAGNOSIS — T63461A Toxic effect of venom of wasps, accidental (unintentional), initial encounter: Secondary | ICD-10-CM

## 2022-12-15 DIAGNOSIS — W57XXXA Bitten or stung by nonvenomous insect and other nonvenomous arthropods, initial encounter: Secondary | ICD-10-CM

## 2022-12-15 DIAGNOSIS — S40862A Insect bite (nonvenomous) of left upper arm, initial encounter: Secondary | ICD-10-CM

## 2022-12-15 MED ORDER — TRIAMCINOLONE ACETONIDE 0.1 % EX CREA: 1.0000 | TOPICAL_CREAM | Freq: Two times a day (BID) | CUTANEOUS | 0 refills | Status: AC

## 2022-12-15 MED ORDER — CEPHALEXIN 500 MG PO CAPS: 1000.0000 mg | ORAL_CAPSULE | Freq: Two times a day (BID) | ORAL | 0 refills | Status: AC

## 2022-12-15 MED ORDER — LORATADINE 10 MG PO TABS: 10.0000 mg | ORAL_TABLET | Freq: Every day | ORAL | 0 refills | Status: AC

## 2022-12-15 MED ORDER — PREDNISONE 10 MG (21) PO TBPK: ORAL_TABLET | ORAL | 0 refills | Status: DC

## 2022-12-15 MED ORDER — EPINEPHRINE 0.3 MG/0.3ML IJ SOAJ: 0.3000 mg | Freq: Once | INTRAMUSCULAR | 0 refills | Status: AC

## 2022-12-15 NOTE — Discharge Instructions (Signed)
Take the Keflex for any possible infection.  You can try the triamcinolone cream first, which is a steroid cream, or you can go ahead and start the prednisone taper.  Start the Claritin will help with swelling and itching.  Apply ice.

## 2022-12-15 NOTE — ED Triage Notes (Signed)
Pt c/o L arm swelling due to wasp sting x1 day. States area red,warm & itchy. Has applied hydrocortisone & ice w/o relief.

## 2022-12-15 NOTE — ED Provider Notes (Signed)
HPI  SUBJECTIVE:  Peter Flores is a 67 y.o. male who presents with left forearm erythema, pain, edema, itching after being stung by a wasp yesterday.  States the erythema and swelling is moving proximally.  He reports urticaria, difficulty breathing after being stung, but it resolved after 3 to 4 hours.  No current urticaria, difficulty breathing.  No angioedema, cough, wheeze, shortness of breath, nausea, vomiting, diarrhea, abdominal pain, syncope, sensation of throat swelling shut,  fevers, body aches.  No change in his meds recently.  His wife applied hydrocortisone OTC onto the hives with improvement in his symptoms.  He also applied ice to his arm with improvement.  Symptoms are worse when he hangs his arm in a dependent position.  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  No history of MRSA.  PCP: [facility].    Past Medical History:  Diagnosis Date   Anxiety    Arthritis    Esophageal reflux 05/15/1999   Family hx colonic polyps    Hemorrhoids    Hiatal hernia 05/15/1999   IBS (irritable bowel syndrome)    Personal history of colonic polyps 05/15/1999   tubulovillous adenoma   Rectal fissure 04/30/1999   Wolff-Parkinson-White (WPW) syndrome    pt states this was a mis diagnosis.    Past Surgical History:  Procedure Laterality Date   BACK SURGERY     x 2    BIOPSY  12/09/2019   Procedure: BIOPSY;  Surgeon: Marguerita Merles, Reuel Boom, MD;  Location: [facility];  Service: Gastroenterology;;  antrum, gastric body   COLONOSCOPY     COLONOSCOPY WITH PROPOFOL N/A 12/09/2019   Procedure: COLONOSCOPY WITH PROPOFOL;  Surgeon: Dolores Frame, MD;  Location: [facility];  Service: Gastroenterology;  Laterality: N/A;  730   ESOPHAGOGASTRODUODENOSCOPY (EGD) WITH PROPOFOL N/A 12/09/2019   Procedure: ESOPHAGOGASTRODUODENOSCOPY (EGD) WITH PROPOFOL;  Surgeon: Dolores Frame, MD;  Location: [facility];  Service: Gastroenterology;  Laterality: N/A;   KNEE SURGERY     Bilateral    POLYPECTOMY     POLYPECTOMY  12/09/2019   Procedure: POLYPECTOMY;  Surgeon: Marguerita Merles, Reuel Boom, MD;  Location: [facility];  Service: Gastroenterology;;  sigmoid    Family History  Problem Relation Age of Onset   Colon polyps Brother    Coronary artery disease Brother 51       Stent   Heart disease Brother    Lung cancer Mother    Cancer Father    Cirrhosis Father    Colon cancer Neg Hx     Social History   Tobacco Use   Smoking status: Former    Current packs/day: 0.00    Average packs/day: 0.3 packs/day for 10.0 years (2.5 ttl pk-yrs)    Types: Cigarettes    Start date: 08/06/1971    Quit date: 08/05/1981    Years since quitting: 41.3   Smokeless tobacco: Never  Vaping Use   Vaping status: Never Used  Substance Use Topics   Alcohol use: No   Drug use: No    No current facility-administered medications for this encounter.  Current Outpatient Medications:    acetaminophen (TYLENOL) 500 MG tablet, Take 500-1,000 mg by mouth every 6 (six) hours as needed (for pain.)., Disp: , Rfl:    ascorbic acid (VITAMIN C) 500 MG tablet, Take 500 mg by mouth daily., Disp: , Rfl:    aspirin EC 81 MG tablet, Take  81 mg by mouth daily. Swallow whole., Disp: , Rfl:    atorvastatin (LIPITOR) 40 MG tablet, Take 40 mg by mouth daily., Disp: , Rfl:    Cholecalciferol (VITAMIN D PO), Take 5,000 Units by mouth daily. , Disp: , Rfl:    ELIQUIS 5 MG TABS tablet, Take 5 mg by mouth 2 (two) times daily., Disp: , Rfl:    EPINEPHrine 0.3 mg/0.3 mL IJ SOAJ injection, Inject 0.3 mg into the muscle once for 1 dose., Disp: 0.3 mL, Rfl: 0   hydrochlorothiazide (HYDRODIURIL) 12.5 MG tablet, Take 12.5 mg by mouth daily., Disp: , Rfl:    hydroxychloroquine (PLAQUENIL) 200 MG tablet, Take 200 mg by mouth 2 (two) times daily., Disp: , Rfl:    Krill Oil 500 MG CAPS, Take 500 mg by mouth daily., Disp: , Rfl:     loratadine (CLARITIN) 10 MG tablet, Take 1 tablet (10 mg total) by mouth daily., Disp: 10 tablet, Rfl: 0   losartan (COZAAR) 50 MG tablet, Take 100 mg by mouth daily., Disp: , Rfl:    metoprolol succinate (TOPROL-XL) 25 MG 24 hr tablet, Take by mouth., Disp: , Rfl:    Omega-3 1000 MG CAPS, Take by mouth., Disp: , Rfl:    omeprazole (PRILOSEC) 20 MG capsule, Take 20 mg by mouth daily., Disp: , Rfl:    potassium chloride SA (KLOR-CON) 20 MEQ tablet, Take 2 tablets (40 mEq total) by mouth once for 1 dose. Take in the AM of 12/08/2019 (day before colonoscopy), Disp: 1 tablet, Rfl: 0   predniSONE (STERAPRED UNI-PAK 21 TAB) 10 MG (21) TBPK tablet, Dispense one 6 day pack. Take as directed with food., Disp: 21 tablet, Rfl: 0   triamcinolone cream (KENALOG) 0.1 %, Apply 1 Application topically 2 (two) times daily. Apply for 2 weeks. May use on face, Disp: 30 g, Rfl: 0  Allergies  Allergen Reactions   Lisinopril Other (See Comments) and Rash    Hypotension, bradycardia, near syncope, full body rash Hypotension, bradycardia, near syncope, full body rash    Vicodin [Hydrocodone-Acetaminophen] Shortness Of Breath   Amlodipine Other (See Comments)    Headaches/nausea/hotflashes   Biaxin [Clarithromycin] Other (See Comments)    Heart Palpatations     ROS  As noted in HPI.   Physical Exam  BP (!) 152/83 (BP Location: Right Arm)   Pulse (!) 55   Temp 98.2 F (36.8 C) (Oral)   Resp 16   Ht 6\' 2"  (1.88 m)   Wt 90.7 kg   SpO2 100%   BMI 25.68 kg/m   Constitutional: Well developed, well nourished, no acute distress Eyes:  EOMI, conjunctiva normal bilaterally HENT: Normocephalic, atraumatic,mucus membranes moist.  No angioedema of the lips or tongue.  Airway widely patent. Respiratory: Normal inspiratory effort, lungs clear bilaterally.  Good air movement Cardiovascular: Normal rate GI: nondistended skin: Swelling, nontender blanchable erythema and edema left forearm extending to the dorsum  of the hand.  Positive increased temperature.  No crusting, induration.  Marked area of erythema with a marker     Musculoskeletal: no deformities Neurologic: Alert & oriented x 3, no focal neuro deficits Psychiatric: Speech and behavior appropriate   ED Course   Medications - No data to display  No orders of the defined types were placed in this encounter.   No results found for this or any previous visit (from the past 24 hour(s)). No results found.  ED Clinical Impression  1. Wasp sting, accidental or unintentional, initial encounter  2. Bug bite with infection, initial encounter      ED Assessment/Plan     Concern for infected wasp sting.  No evidence of anaphylaxis at this time.  Will send home with triamcinolone cream, wait-and-see prescription of prednisone taper, he is to start Claritin, continue ice.  Keflex to cover any possible infection.  Follow-up with PCP.  ER return precautions given. Discussed labs, imaging, MDM, treatment plan, and plan for follow-up with patient. Discussed sn/sx that should prompt return to the ED. patient agrees with plan.   Meds ordered this encounter  Medications   EPINEPHrine 0.3 mg/0.3 mL IJ SOAJ injection    Sig: Inject 0.3 mg into the muscle once for 1 dose.    Dispense:  0.3 mL    Refill:  0   loratadine (CLARITIN) 10 MG tablet    Sig: Take 1 tablet (10 mg total) by mouth daily.    Dispense:  10 tablet    Refill:  0   predniSONE (STERAPRED UNI-PAK 21 TAB) 10 MG (21) TBPK tablet    Sig: Dispense one 6 day pack. Take as directed with food.    Dispense:  21 tablet    Refill:  0   triamcinolone cream (KENALOG) 0.1 %    Sig: Apply 1 Application topically 2 (two) times daily. Apply for 2 weeks. May use on face    Dispense:  30 g    Refill:  0      *This clinic note was created using Scientist, clinical (histocompatibility and immunogenetics). Therefore, there may be occasional mistakes despite careful proofreading.  ?    Domenick Gong,  MD 12/16/22 [phone]

## 2022-12-15 NOTE — Telephone Encounter (Signed)
Plan was to prescribe Keflex 1000 mg p.o. twice daily for 5 days.  Forgot to prescribe at time of discharge.  Will E prescribe it now.

## 2022-12-19 ENCOUNTER — Telehealth (INDEPENDENT_AMBULATORY_CARE_PROVIDER_SITE_OTHER): Payer: Self-pay | Admitting: Gastroenterology

## 2022-12-19 DIAGNOSIS — Z8619 Personal history of other infectious and parasitic diseases: Secondary | ICD-10-CM

## 2022-12-19 NOTE — Telephone Encounter (Signed)
Who is your primary care physician: [facility]  Reasons for the EGD: 3 year recall  Are you diabetic? If yes, Type 1 or Type 2?    no  Do you have a prosthetic or mechanical heart valve? no  Do you have a pacemaker/defibrillator?   no  Have you had endocarditis/atrial fibrillation? Yes a fib ablation surgery oct 2023  Have you had joint replacement within the last 12 months?  no  Do you have any history of drugs or alchohol?  no  Do you use supplemental oxygen?  no  Have you had a stroke or heart attack within the last 6 months? no  Do you take weight loss medication?  no    Do you take any blood-thinning medications such as: (aspirin, warfarin, Plavix, Aggrenox)  yes  If yes we need the name, milligram, dosage and who is prescribing doctor: Eliquis 5 mg 2x day Current Outpatient Medications on File Prior to Visit  Medication Sig Dispense Refill   acetaminophen (TYLENOL) 500 MG tablet Take 500-1,000 mg by mouth every 6 (six) hours as needed (for pain.).     ascorbic acid (VITAMIN C) 500 MG tablet Take 500 mg by mouth daily.     aspirin EC 81 MG tablet Take 81 mg by mouth daily. Swallow whole.     atorvastatin (LIPITOR) 40 MG tablet Take 40 mg by mouth daily.     cephALEXin (KEFLEX) 500 MG capsule Take 2 capsules (1,000 mg total) by mouth 2 (two) times daily for 5 days. 20 capsule 0   Cholecalciferol (VITAMIN D PO) Take 5,000 Units by mouth daily.      ELIQUIS 5 MG TABS tablet Take 5 mg by mouth 2 (two) times daily.     hydrochlorothiazide (HYDRODIURIL) 12.5 MG tablet Take 12.5 mg by mouth daily.     hydroxychloroquine (PLAQUENIL) 200 MG tablet Take 200 mg by mouth 2 (two) times daily.     Krill Oil 500 MG CAPS Take 500 mg by mouth daily.     loratadine (CLARITIN) 10 MG tablet Take 1 tablet (10 mg total) by mouth daily. 10 tablet 0   losartan (COZAAR) 50 MG tablet Take 100 mg by mouth daily.     metoprolol succinate (TOPROL-XL) 25 MG 24 hr tablet Take by mouth.     Omega-3 1000 MG  CAPS Take by mouth.     omeprazole (PRILOSEC) 20 MG capsule Take 20 mg by mouth daily.     predniSONE (STERAPRED UNI-PAK 21 TAB) 10 MG (21) TBPK tablet Dispense one 6 day pack. Take as directed with food. 21 tablet 0   triamcinolone cream (KENALOG) 0.1 % Apply 1 Application topically 2 (two) times daily. Apply for 2 weeks. May use on face 30 g 0   potassium chloride SA (KLOR-CON) 20 MEQ tablet Take 2 tablets (40 mEq total) by mouth once for 1 dose. Take in the AM of 12/08/2019 (day before colonoscopy) 1 tablet 0   No current facility-administered medications on file prior to visit.    Allergies  Allergen Reactions   Lisinopril Other (See Comments) and Rash    Hypotension, bradycardia, near syncope, full body rash Hypotension, bradycardia, near syncope, full body rash    Vicodin [Hydrocodone-Acetaminophen] Shortness Of Breath   Amlodipine Other (See Comments)    Headaches/nausea/hotflashes   Biaxin [Clarithromycin] Other (See Comments)    Heart Palpatations     Pharmacy:   Primary Insurance Name: [employer] number where you can be reached: [phone]

## 2022-12-19 NOTE — Telephone Encounter (Signed)
Room 1 Thanks 

## 2022-12-22 NOTE — Telephone Encounter (Signed)
Daughter returned call and states that 01/07/23 will be fine. Instructions will be sent via my chart and via mail. Lab order placed.

## 2022-12-22 NOTE — Telephone Encounter (Signed)
Pt daughter Shawna Orleans contacted. Tentative date 01/07/23 at 11:30am. Daughter will call back once she gets in touch with patient.

## 2022-12-22 NOTE — Addendum Note (Signed)
Addended by: Marlowe Shores on: 12/22/2022 04:46 PM   Modules accepted: Orders

## 2023-01-05 ENCOUNTER — Encounter: Payer: Self-pay | Admitting: *Deleted

## 2023-01-05 NOTE — Telephone Encounter (Signed)
Pt's daughter Shawna [place] (on [place]) to reschedule procedure from 01/07/23 to 01/23/23 at 10:00 am with Dr.Castaneda. There was a death in the family. Updated instructions sent via MyChart.

## 2023-01-22 ENCOUNTER — Encounter: Payer: Self-pay | Admitting: *Deleted

## 2023-01-22 ENCOUNTER — Telehealth: Payer: Self-pay | Admitting: *Deleted

## 2023-01-22 NOTE — Telephone Encounter (Signed)
Carron Brazen, RN:  patient needs to reschedule EGD for tomorrow; states he has had a lot going on and forgot and took eliquis today

## 2023-01-22 NOTE — Telephone Encounter (Signed)
Thanks

## 2023-01-22 NOTE — Progress Notes (Signed)
Called pt regarding scheduled EGD for tomorrow.  Pt states he has had a lot going on and forgot about procedure.  Needs to reschedule.  Message sent to office and pt states he will also notify office.

## 2023-01-22 NOTE — Telephone Encounter (Signed)
Pt has been rescheduled from 01/23/23 until 02/13/23 at 1:15 pm with Dr.Castaneda. Updated instructions sent via MyChart.

## 2023-02-11 ENCOUNTER — Other Ambulatory Visit (HOSPITAL_COMMUNITY)
Admission: RE | Admit: 2023-02-11 | Discharge: 2023-02-11 | Disposition: A | Discharge status: Home / Self Care | Payer: Medicare HMO | Source: Ambulatory Visit | Attending: Gastroenterology | Admitting: Gastroenterology

## 2023-02-11 DIAGNOSIS — Z8619 Personal history of other infectious and parasitic diseases: Secondary | ICD-10-CM | POA: Insufficient documentation

## 2023-02-11 LAB — BASIC METABOLIC PANEL
Anion gap: 9 (ref 5–15)
BUN: 20 mg/dL (ref 8–23)
CO2: 24 mmol/L (ref 22–32)
Calcium: 9.3 mg/dL (ref 8.9–10.3)
Chloride: 102 mmol/L (ref 98–111)
Creatinine, Ser: 0.95 mg/dL (ref 0.61–1.24)
GFR, Estimated: >60 mL/min (ref >60)
Glucose, Bld: 121 mg/dL — ABNORMAL HIGH (ref 70–99)
Potassium: 3.7 mmol/L (ref 3.5–5.1)
Sodium: 135 mmol/L (ref 135–145)

## 2023-02-13 ENCOUNTER — Ambulatory Visit (HOSPITAL_COMMUNITY): Payer: Medicare HMO | Admitting: Certified Registered Nurse Anesthetist

## 2023-02-13 ENCOUNTER — Ambulatory Visit (HOSPITAL_COMMUNITY)
Admission: RE | Admit: 2023-02-13 | Discharge: 2023-02-13 | Disposition: A | Discharge status: Home / Self Care | Payer: Medicare HMO | Attending: Gastroenterology | Admitting: Gastroenterology

## 2023-02-13 ENCOUNTER — Encounter (HOSPITAL_COMMUNITY): Payer: Self-pay | Admitting: Gastroenterology

## 2023-02-13 ENCOUNTER — Other Ambulatory Visit: Payer: Self-pay

## 2023-02-13 ENCOUNTER — Encounter (HOSPITAL_COMMUNITY)
Admission: RE | Disposition: A | Discharge status: Home / Self Care | Payer: Self-pay | Source: Home / Self Care | Attending: Gastroenterology

## 2023-02-13 DIAGNOSIS — K2940 Chronic atrophic gastritis without bleeding: Secondary | ICD-10-CM

## 2023-02-13 DIAGNOSIS — K31A11 Gastric intestinal metaplasia without dysplasia, involving the antrum: Secondary | ICD-10-CM | POA: Insufficient documentation

## 2023-02-13 DIAGNOSIS — F419 Anxiety disorder, unspecified: Secondary | ICD-10-CM

## 2023-02-13 DIAGNOSIS — I10 Essential (primary) hypertension: Secondary | ICD-10-CM | POA: Insufficient documentation

## 2023-02-13 DIAGNOSIS — Z8249 Family history of ischemic heart disease and other diseases of the circulatory system: Secondary | ICD-10-CM | POA: Diagnosis not present

## 2023-02-13 DIAGNOSIS — Z87891 Personal history of nicotine dependence: Secondary | ICD-10-CM | POA: Insufficient documentation

## 2023-02-13 DIAGNOSIS — K3189 Other diseases of stomach and duodenum: Secondary | ICD-10-CM | POA: Diagnosis not present

## 2023-02-13 DIAGNOSIS — K219 Gastro-esophageal reflux disease without esophagitis: Secondary | ICD-10-CM | POA: Diagnosis not present

## 2023-02-13 DIAGNOSIS — Z8379 Family history of other diseases of the digestive system: Secondary | ICD-10-CM | POA: Insufficient documentation

## 2023-02-13 DIAGNOSIS — G709 Myoneural disorder, unspecified: Secondary | ICD-10-CM | POA: Diagnosis not present

## 2023-02-13 DIAGNOSIS — K31A0 Gastric intestinal metaplasia, unspecified: Secondary | ICD-10-CM

## 2023-02-13 HISTORY — PX: BIOPSY: SHX5522

## 2023-02-13 HISTORY — PX: ESOPHAGOGASTRODUODENOSCOPY (EGD) WITH PROPOFOL: SHX5813

## 2023-02-13 HISTORY — DX: Unspecified atrial fibrillation: I48.91

## 2023-02-13 SURGERY — ESOPHAGOGASTRODUODENOSCOPY (EGD) WITH PROPOFOL
Anesthesia: General

## 2023-02-13 MED ORDER — LACTATED RINGERS IV SOLN
INTRAVENOUS | Status: DC | PRN
Start: 2023-02-13 — End: 2023-02-13

## 2023-02-13 MED ORDER — PROPOFOL 10 MG/ML IV BOLUS
INTRAVENOUS | Status: DC | PRN
  Administered 2023-02-13: 80 mg via INTRAVENOUS

## 2023-02-13 MED ORDER — LIDOCAINE 2% (20 MG/ML) 5 ML SYRINGE
INTRAMUSCULAR | Status: DC | PRN
  Administered 2023-02-13: 50 mg via INTRAVENOUS

## 2023-02-13 MED ORDER — PHENYLEPHRINE 80 MCG/ML (10ML) SYRINGE FOR IV PUSH (FOR BLOOD PRESSURE SUPPORT)
PREFILLED_SYRINGE | INTRAVENOUS | Status: AC
  Filled 2023-02-13: qty 10

## 2023-02-13 MED ORDER — LIDOCAINE HCL (PF) 2 % IJ SOLN
INTRAMUSCULAR | Status: AC
  Filled 2023-02-13: qty 5

## 2023-02-13 MED ORDER — LACTATED RINGERS IV SOLN: INTRAVENOUS | Status: DC

## 2023-02-13 MED ORDER — PROPOFOL 500 MG/50ML IV EMUL
INTRAVENOUS | Status: DC | PRN
  Administered 2023-02-13: 180 ug/kg/min via INTRAVENOUS

## 2023-02-13 NOTE — Discharge Instructions (Addendum)
You are being discharged to home.  ?Resume your previous diet.  ?We are waiting for your pathology results.  ?Restart Eliquis tonight. ?

## 2023-02-13 NOTE — Op Note (Signed)
[facility] Patient Name: Peter Flores Procedure Date: 02/13/2023 12:50 PM MRN: [id number] Date of Birth: August 25, 1955 Attending MD: Katrinka Blazing , , [id number] CSN: [id number] Age: 67 Admit Type: Outpatient Procedure:                Upper GI endoscopy Indications:              Surveillance procedure - gastric intestinal                            metaplasia Providers:                Katrinka Blazing, Edrick Kins, RN, Elinor Parkinson Referring MD:              Medicines:                Monitored Anesthesia Care Complications:            No immediate complications. Estimated Blood Loss:     Estimated blood loss: none. Procedure:                Pre-Anesthesia Assessment:                           - Prior to the procedure, a History and Physical                            was performed, and patient medications, allergies                            and sensitivities were reviewed. The patient's                            tolerance of previous anesthesia was reviewed.                           - The risks and benefits of the procedure and the                            sedation options and risks were discussed with the                            patient. All questions were answered and informed                            consent was obtained.                           - ASA Grade Assessment: II - A patient with mild                            systemic disease.                           After obtaining informed consent, the endoscope was                            passed under direct vision. Throughout the  procedure, the patient's blood pressure, pulse, and                            oxygen saturations were monitored continuously. The                            GIF-H190 ([id number]) scope was introduced through the                            mouth, and advanced to the second part of duodenum.                            The upper GI endoscopy was  accomplished without                            difficulty. The patient tolerated the procedure                            well. Scope In: 12:55:53 PM Scope Out: 1:00:23 PM Total Procedure Duration: 0 hours 4 minutes 30 seconds  Findings:      The examined esophagus was normal.      Diffuse atrophic mucosa was found in the gastric antrum. Imaging was       performed using white light and narrow band imaging to visualize the       mucosa. Biopsies from body, incisura and antrum were taken following       Sydney protocol with a cold forceps for histology.      The examined duodenum was normal. Impression:               - Normal esophagus.                           - Gastric mucosal atrophy. Biopsied.                           - Normal examined duodenum. Moderate Sedation:      Per Anesthesia Care Recommendation:           - Discharge patient to home (ambulatory).                           - Resume previous diet.                           - Await pathology results. Procedure Code(s):        --- Professional ---                           [phone], Esophagogastroduodenoscopy, flexible,                            transoral; with biopsy, single or multiple Diagnosis Code(s):        --- Professional ---                           K31.89, Other diseases of stomach and duodenum CPT copyright 2022 [employer]. All  rights reserved. The codes documented in this report are preliminary and upon coder review may  be revised to meet current compliance requirements. Katrinka Blazing, MD Katrinka Blazing,  02/13/2023 1:08:06 PM This report has been signed electronically. Number of Addenda: 0

## 2023-02-13 NOTE — Anesthesia Preprocedure Evaluation (Signed)
Anesthesia Evaluation  Patient identified by MRN, date of birth, ID band Patient awake    Reviewed: Allergy & Precautions, H&P , NPO status , Patient's Chart, lab work & pertinent test results, reviewed documented beta blocker date and time   Airway Mallampati: II  TM Distance: >3 FB Neck ROM: full    Dental no notable dental hx.    Pulmonary neg pulmonary ROS, shortness of breath, former smoker   Pulmonary exam normal breath sounds clear to auscultation       Cardiovascular Exercise Tolerance: Good hypertension, negative cardio ROS  Rhythm:regular Rate:Normal     Neuro/Psych   Anxiety      Neuromuscular disease negative neurological ROS  negative psych ROS   GI/Hepatic negative GI ROS, Neg liver ROS, hiatal hernia,GERD  ,,  Endo/Other  negative endocrine ROS    Renal/GU negative Renal ROS  negative genitourinary   Musculoskeletal   Abdominal   Peds  Hematology negative hematology ROS (+)   Anesthesia Other Findings   Reproductive/Obstetrics negative OB ROS                             Anesthesia Physical Anesthesia Plan  ASA: 4 and emergent  Anesthesia Plan: General   Post-op Pain Management:    Induction:   PONV Risk Score and Plan: Propofol infusion  Airway Management Planned:   Additional Equipment:   Intra-op Plan:   Post-operative Plan:   Informed Consent: I have reviewed the patients History and Physical, chart, labs and discussed the procedure including the risks, benefits and alternatives for the proposed anesthesia with the patient or authorized representative who has indicated his/her understanding and acceptance.     Dental Advisory Given  Plan Discussed with: CRNA  Anesthesia Plan Comments:        Anesthesia Quick Evaluation

## 2023-02-13 NOTE — Transfer of Care (Signed)
Immediate Anesthesia Transfer of Care Note  Patient: Peter Flores  Procedure(s) Performed: ESOPHAGOGASTRODUODENOSCOPY (EGD) WITH PROPOFOL BIOPSY  Patient Location: Endoscopy Unit  Anesthesia Type:General  Level of Consciousness: drowsy, patient cooperative, and responds to stimulation  Airway & Oxygen Therapy: Patient Spontanous Breathing  Post-op Assessment: Report given to RN, Post -op Vital signs reviewed and stable, Patient moving all extremities X 4, and Patient able to stick tongue midline  Post vital signs: Reviewed and stable  Last Vitals:  Vitals Value Taken Time  BP 124/67 02/13/23 1305  Temp 36.4 C 02/13/23 1305  Pulse 47 02/13/23 1305  Resp 15 02/13/23 1305  SpO2 98 % 02/13/23 1305    Last Pain:  Vitals:   02/13/23 1305  TempSrc: Oral  PainSc:       Patients Stated Pain Goal: 9 (02/13/23 1228)  Complications: No notable events documented.

## 2023-02-13 NOTE — H&P (Signed)
Peter Flores is an 67 y.o. male.   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  The patient denies having any nausea, vomiting, fever, chills, hematochezia, melena, hematemesis, abdominal distention, abdominal pain, diarrhea, jaundice, pruritus or weight loss.  Past Medical History:  Diagnosis Date   A-fib [facility])    Anxiety    Arthritis    Esophageal reflux 05/15/1999   Family hx colonic polyps    Hemorrhoids    Hiatal hernia 05/15/1999   IBS (irritable bowel syndrome)    Personal history of colonic polyps 05/15/1999   tubulovillous adenoma   Rectal fissure 04/30/1999   Wolff-Parkinson-White (WPW) syndrome    pt states this was a mis diagnosis.    Past Surgical History:  Procedure Laterality Date   BACK SURGERY     x 2    BIOPSY  12/09/2019   Procedure: BIOPSY;  Surgeon: Marguerita Merles, Reuel Boom, MD;  Location: [facility];  Service: Gastroenterology;;  antrum, gastric body   CARDIAC ELECTROPHYSIOLOGY STUDY AND ABLATION     COLONOSCOPY     COLONOSCOPY WITH PROPOFOL N/A 12/09/2019   Procedure: COLONOSCOPY WITH PROPOFOL;  Surgeon: Dolores Frame, MD;  Location: [facility];  Service: Gastroenterology;  Laterality: N/A;  730   ESOPHAGOGASTRODUODENOSCOPY (EGD) WITH PROPOFOL N/A 12/09/2019   Procedure: ESOPHAGOGASTRODUODENOSCOPY (EGD) WITH PROPOFOL;  Surgeon: Dolores Frame, MD;  Location: [facility];  Service: Gastroenterology;  Laterality: N/A;   KNEE SURGERY     Bilateral    POLYPECTOMY     POLYPECTOMY  12/09/2019   Procedure: POLYPECTOMY;  Surgeon: Dolores Frame, MD;  Location: [facility];  Service: Gastroenterology;;  sigmoid    Family History  Problem Relation Age of Onset   Colon polyps Brother    Coronary artery disease Brother 39       Stent   Heart disease Brother     Lung cancer Mother    Cancer Father    Cirrhosis Father    Colon cancer Neg Hx    Social History:  reports that he quit smoking about 41 years ago. His smoking use included cigarettes. He started smoking about 51 years ago. He has a 2.5 pack-year smoking history. He has never used smokeless tobacco. He reports that he does not drink alcohol and does not use drugs.  Allergies:  Allergies  Allergen Reactions   Lisinopril Other (See Comments) and Rash    Hypotension, bradycardia, near syncope, full body rash Hypotension, bradycardia, near syncope, full body rash    Vicodin [Hydrocodone-Acetaminophen] Shortness Of Breath   Amlodipine Other (See Comments)    Headaches/nausea/hotflashes   Biaxin [Clarithromycin] Other (See Comments)    Heart Palpatations    Medications Prior to Admission  Medication Sig Dispense Refill   ascorbic acid (VITAMIN C) 500 MG tablet Take 500 mg by mouth daily.     atorvastatin (LIPITOR) 40 MG tablet Take 40 mg by mouth daily.     Cholecalciferol (VITAMIN D PO) Take 5,000 Units by mouth daily.      hydrochlorothiazide (HYDRODIURIL) 12.5 MG tablet Take 12.5 mg by mouth daily.     hydroxychloroquine (PLAQUENIL) 200 MG tablet Take 200 mg by mouth 2 (two) times daily.     Krill Oil 500 MG CAPS Take 500 mg by mouth daily.     loratadine (CLARITIN) 10 MG tablet Take 1 tablet (10 mg total) by mouth  daily. 10 tablet 0   losartan (COZAAR) 50 MG tablet Take 100 mg by mouth daily.     metoprolol succinate (TOPROL-XL) 25 MG 24 hr tablet Take by mouth.     Omega-3 1000 MG CAPS Take by mouth.     omeprazole (PRILOSEC) 20 MG capsule Take 20 mg by mouth daily.     acetaminophen (TYLENOL) 500 MG tablet Take 500-1,000 mg by mouth every 6 (six) hours as needed (for pain.).     aspirin EC 81 MG tablet Take 81 mg by mouth daily. Swallow whole.     ELIQUIS 5 MG TABS tablet Take 5 mg by mouth 2 (two) times daily.     potassium chloride SA (KLOR-CON) 20 MEQ tablet Take 2 tablets  (40 mEq total) by mouth once for 1 dose. Take in the AM of 12/08/2019 (day before colonoscopy) 1 tablet 0   predniSONE (STERAPRED UNI-PAK 21 TAB) 10 MG (21) TBPK tablet Dispense one 6 day pack. Take as directed with food. 21 tablet 0   triamcinolone cream (KENALOG) 0.1 % Apply 1 Application topically 2 (two) times daily. Apply for 2 weeks. May use on face 30 g 0    No results found for this or any previous visit (from the past 48 hour(s)). No results found.  Review of Systems  All other systems reviewed and are negative.   Blood pressure (!) 173/91, pulse (!) 53, temperature 97.8 F (36.6 C), resp. rate 17, height 6\' 1"  (1.854 m), weight 90.7 kg, SpO2 100%. Physical Exam  GENERAL: The patient is AO x3, in no acute distress. HEENT: Head is normocephalic and atraumatic. EOMI are intact. Mouth is well hydrated and without lesions. NECK: Supple. No masses LUNGS: Clear to auscultation. No presence of rhonchi/wheezing/rales. Adequate chest expansion HEART: RRR, normal s1 and s2. ABDOMEN: Soft, nontender, no guarding, no peritoneal signs, and nondistended. BS +. No masses. EXTREMITIES: Without any cyanosis, clubbing, rash, lesions or edema. NEUROLOGIC: AOx3, no focal motor deficit. 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  We will proceed with EGD.  Dolores Frame, MD 02/13/2023, 12:49 PM

## 2023-02-16 LAB — SURGICAL PATHOLOGY

## 2023-02-16 NOTE — Anesthesia Postprocedure Evaluation (Signed)
Anesthesia Post Note  Patient: Peter Flores  Procedure(s) Performed: ESOPHAGOGASTRODUODENOSCOPY (EGD) WITH PROPOFOL BIOPSY  Patient location during evaluation: Phase II Anesthesia Type: General Level of consciousness: awake Pain management: pain level controlled Vital Signs Assessment: post-procedure vital signs reviewed and stable Respiratory status: spontaneous breathing and respiratory function stable Cardiovascular status: blood pressure returned to baseline and stable Postop Assessment: no headache and no apparent nausea or vomiting Anesthetic complications: no Comments: Late entry   No notable events documented.   Last Vitals:  Vitals:   02/13/23 1228 02/13/23 1305  BP: (!) 173/91 124/67  Pulse: (!) 53 (!) 47  Resp: 17 15  Temp: 36.6 C 36.4 C  SpO2: 100% 98%    Last Pain:  Vitals:   02/13/23 1310  TempSrc:   PainSc: 0-No pain                 Windell Norfolk

## 2023-02-17 ENCOUNTER — Encounter (INDEPENDENT_AMBULATORY_CARE_PROVIDER_SITE_OTHER): Payer: Self-pay | Admitting: *Deleted

## 2023-02-17 DIAGNOSIS — N401 Enlarged prostate with lower urinary tract symptoms: Secondary | ICD-10-CM | POA: Diagnosis not present

## 2023-02-17 DIAGNOSIS — J209 Acute bronchitis, unspecified: Secondary | ICD-10-CM | POA: Diagnosis not present

## 2023-02-17 DIAGNOSIS — E663 Overweight: Secondary | ICD-10-CM | POA: Diagnosis not present

## 2023-02-17 DIAGNOSIS — G4733 Obstructive sleep apnea (adult) (pediatric): Secondary | ICD-10-CM | POA: Diagnosis not present

## 2023-02-17 DIAGNOSIS — Z6825 Body mass index (BMI) 25.0-25.9, adult: Secondary | ICD-10-CM | POA: Diagnosis not present

## 2023-02-17 DIAGNOSIS — I10 Essential (primary) hypertension: Secondary | ICD-10-CM | POA: Diagnosis not present

## 2023-02-20 ENCOUNTER — Other Ambulatory Visit (HOSPITAL_COMMUNITY): Payer: Self-pay | Admitting: Internal Medicine

## 2023-02-20 ENCOUNTER — Ambulatory Visit (HOSPITAL_COMMUNITY)
Admission: RE | Admit: 2023-02-20 | Discharge: 2023-02-20 | Disposition: A | Discharge status: Home / Self Care | Payer: Medicare HMO | Source: Ambulatory Visit | Attending: Internal Medicine | Admitting: Internal Medicine

## 2023-02-20 DIAGNOSIS — R052 Subacute cough: Secondary | ICD-10-CM | POA: Diagnosis not present

## 2023-02-20 DIAGNOSIS — R059 Cough, unspecified: Secondary | ICD-10-CM | POA: Diagnosis not present

## 2023-02-23 ENCOUNTER — Encounter (HOSPITAL_COMMUNITY): Payer: Self-pay | Admitting: Gastroenterology

## 2023-02-27 DIAGNOSIS — Z6825 Body mass index (BMI) 25.0-25.9, adult: Secondary | ICD-10-CM | POA: Diagnosis not present

## 2023-02-27 DIAGNOSIS — J209 Acute bronchitis, unspecified: Secondary | ICD-10-CM | POA: Diagnosis not present

## 2023-02-27 DIAGNOSIS — R052 Subacute cough: Secondary | ICD-10-CM | POA: Diagnosis not present

## 2023-02-27 DIAGNOSIS — E663 Overweight: Secondary | ICD-10-CM | POA: Diagnosis not present

## 2023-05-05 DIAGNOSIS — Z6826 Body mass index (BMI) 26.0-26.9, adult: Secondary | ICD-10-CM | POA: Diagnosis not present

## 2023-05-05 DIAGNOSIS — A493 Mycoplasma infection, unspecified site: Secondary | ICD-10-CM | POA: Diagnosis not present

## 2023-05-05 DIAGNOSIS — E663 Overweight: Secondary | ICD-10-CM | POA: Diagnosis not present

## 2023-06-10 DIAGNOSIS — E663 Overweight: Secondary | ICD-10-CM | POA: Diagnosis not present

## 2023-06-10 DIAGNOSIS — Z6826 Body mass index (BMI) 26.0-26.9, adult: Secondary | ICD-10-CM | POA: Diagnosis not present

## 2023-06-10 DIAGNOSIS — L931 Subacute cutaneous lupus erythematosus: Secondary | ICD-10-CM | POA: Diagnosis not present

## 2023-06-18 DIAGNOSIS — Z87891 Personal history of nicotine dependence: Secondary | ICD-10-CM | POA: Diagnosis not present

## 2023-06-18 DIAGNOSIS — E785 Hyperlipidemia, unspecified: Secondary | ICD-10-CM | POA: Diagnosis not present

## 2023-06-18 DIAGNOSIS — Z885 Allergy status to narcotic agent status: Secondary | ICD-10-CM | POA: Diagnosis not present

## 2023-06-18 DIAGNOSIS — R000 Tachycardia, unspecified: Secondary | ICD-10-CM | POA: Diagnosis not present

## 2023-06-18 DIAGNOSIS — R002 Palpitations: Secondary | ICD-10-CM | POA: Diagnosis not present

## 2023-06-18 DIAGNOSIS — R0789 Other chest pain: Secondary | ICD-10-CM | POA: Diagnosis not present

## 2023-06-18 DIAGNOSIS — R079 Chest pain, unspecified: Secondary | ICD-10-CM | POA: Diagnosis not present

## 2023-06-18 DIAGNOSIS — I444 Left anterior fascicular block: Secondary | ICD-10-CM | POA: Diagnosis not present

## 2023-06-18 DIAGNOSIS — I10 Essential (primary) hypertension: Secondary | ICD-10-CM | POA: Diagnosis not present

## 2023-06-18 DIAGNOSIS — I493 Ventricular premature depolarization: Secondary | ICD-10-CM | POA: Diagnosis not present

## 2023-07-07 DIAGNOSIS — I480 Paroxysmal atrial fibrillation: Secondary | ICD-10-CM | POA: Diagnosis not present

## 2023-07-07 DIAGNOSIS — I2510 Atherosclerotic heart disease of native coronary artery without angina pectoris: Secondary | ICD-10-CM | POA: Diagnosis not present

## 2023-07-07 DIAGNOSIS — Z79899 Other long term (current) drug therapy: Secondary | ICD-10-CM | POA: Diagnosis not present

## 2023-07-07 DIAGNOSIS — E785 Hyperlipidemia, unspecified: Secondary | ICD-10-CM | POA: Diagnosis not present

## 2023-07-07 DIAGNOSIS — Z7901 Long term (current) use of anticoagulants: Secondary | ICD-10-CM | POA: Diagnosis not present

## 2023-07-07 DIAGNOSIS — R001 Bradycardia, unspecified: Secondary | ICD-10-CM | POA: Diagnosis not present

## 2023-07-07 DIAGNOSIS — I10 Essential (primary) hypertension: Secondary | ICD-10-CM | POA: Diagnosis not present

## 2023-07-07 DIAGNOSIS — I493 Ventricular premature depolarization: Secondary | ICD-10-CM | POA: Diagnosis not present

## 2023-07-07 DIAGNOSIS — I4510 Unspecified right bundle-branch block: Secondary | ICD-10-CM | POA: Diagnosis not present

## 2023-07-07 DIAGNOSIS — R739 Hyperglycemia, unspecified: Secondary | ICD-10-CM | POA: Diagnosis not present

## 2023-07-07 DIAGNOSIS — Z87891 Personal history of nicotine dependence: Secondary | ICD-10-CM | POA: Diagnosis not present

## 2023-07-08 DIAGNOSIS — I480 Paroxysmal atrial fibrillation: Secondary | ICD-10-CM | POA: Diagnosis not present

## 2023-07-30 DIAGNOSIS — L931 Subacute cutaneous lupus erythematosus: Secondary | ICD-10-CM | POA: Diagnosis not present

## 2023-10-05 DIAGNOSIS — R7303 Prediabetes: Secondary | ICD-10-CM | POA: Diagnosis not present

## 2023-10-05 DIAGNOSIS — I493 Ventricular premature depolarization: Secondary | ICD-10-CM | POA: Diagnosis not present

## 2023-10-05 DIAGNOSIS — I480 Paroxysmal atrial fibrillation: Secondary | ICD-10-CM | POA: Diagnosis not present

## 2023-10-05 DIAGNOSIS — Z888 Allergy status to other drugs, medicaments and biological substances status: Secondary | ICD-10-CM | POA: Diagnosis not present

## 2023-10-05 DIAGNOSIS — Z79899 Other long term (current) drug therapy: Secondary | ICD-10-CM | POA: Diagnosis not present

## 2023-10-05 DIAGNOSIS — G4733 Obstructive sleep apnea (adult) (pediatric): Secondary | ICD-10-CM | POA: Diagnosis not present

## 2023-10-05 DIAGNOSIS — Z7901 Long term (current) use of anticoagulants: Secondary | ICD-10-CM | POA: Diagnosis not present

## 2023-10-05 DIAGNOSIS — Z885 Allergy status to narcotic agent status: Secondary | ICD-10-CM | POA: Diagnosis not present

## 2023-10-05 DIAGNOSIS — R001 Bradycardia, unspecified: Secondary | ICD-10-CM | POA: Diagnosis not present

## 2023-10-05 DIAGNOSIS — Z87891 Personal history of nicotine dependence: Secondary | ICD-10-CM | POA: Diagnosis not present

## 2023-10-05 DIAGNOSIS — I10 Essential (primary) hypertension: Secondary | ICD-10-CM | POA: Diagnosis not present

## 2023-10-05 DIAGNOSIS — R002 Palpitations: Secondary | ICD-10-CM | POA: Diagnosis not present

## 2023-10-05 DIAGNOSIS — I2510 Atherosclerotic heart disease of native coronary artery without angina pectoris: Secondary | ICD-10-CM | POA: Diagnosis not present

## 2023-10-07 DIAGNOSIS — I480 Paroxysmal atrial fibrillation: Secondary | ICD-10-CM | POA: Diagnosis not present

## 2023-11-05 DIAGNOSIS — L931 Subacute cutaneous lupus erythematosus: Secondary | ICD-10-CM | POA: Diagnosis not present

## 2024-01-06 DIAGNOSIS — I493 Ventricular premature depolarization: Secondary | ICD-10-CM | POA: Diagnosis not present

## 2024-01-06 DIAGNOSIS — I480 Paroxysmal atrial fibrillation: Secondary | ICD-10-CM | POA: Diagnosis not present

## 2024-02-22 DIAGNOSIS — I493 Ventricular premature depolarization: Secondary | ICD-10-CM | POA: Diagnosis not present

## 2024-02-29 DIAGNOSIS — M1712 Unilateral primary osteoarthritis, left knee: Secondary | ICD-10-CM | POA: Diagnosis not present

## 2024-03-02 ENCOUNTER — Encounter (INDEPENDENT_AMBULATORY_CARE_PROVIDER_SITE_OTHER): Payer: Self-pay | Admitting: Gastroenterology

## 2024-04-19 DIAGNOSIS — I493 Ventricular premature depolarization: Secondary | ICD-10-CM | POA: Diagnosis not present

## 2024-04-19 DIAGNOSIS — I480 Paroxysmal atrial fibrillation: Secondary | ICD-10-CM | POA: Diagnosis not present

## 2024-04-20 DIAGNOSIS — I493 Ventricular premature depolarization: Secondary | ICD-10-CM | POA: Diagnosis not present

## 2024-05-05 ENCOUNTER — Ambulatory Visit
Admission: RE | Admit: 2024-05-05 | Discharge: 2024-05-05 | Disposition: A | Discharge status: Home / Self Care | Source: Ambulatory Visit | Attending: Emergency Medicine | Admitting: Emergency Medicine

## 2024-05-05 VITALS — BP 154/79 | HR 58 | Temp 97.9°F | Wt 202.2 lb

## 2024-05-05 DIAGNOSIS — J069 Acute upper respiratory infection, unspecified: Secondary | ICD-10-CM

## 2024-05-05 DIAGNOSIS — R0981 Nasal congestion: Secondary | ICD-10-CM | POA: Diagnosis not present

## 2024-05-05 MED ORDER — BENZONATATE 100 MG PO CAPS: 200.0000 mg | ORAL_CAPSULE | Freq: Three times a day (TID) | ORAL | 0 refills | Status: AC | PRN

## 2024-05-05 MED ORDER — AEROCHAMBER MV MISC: 2 refills | Status: AC

## 2024-05-05 MED ORDER — PROMETHAZINE-DM 6.25-15 MG/5ML PO SYRP: 5.0000 mL | ORAL_SOLUTION | Freq: Four times a day (QID) | ORAL | 0 refills | Status: AC | PRN

## 2024-05-05 MED ORDER — ALBUTEROL SULFATE HFA 108 (90 BASE) MCG/ACT IN AERS: 2.0000 | INHALATION_SPRAY | RESPIRATORY_TRACT | 0 refills | Status: AC | PRN

## 2024-05-05 NOTE — Discharge Instructions (Addendum)
 As we discussed, your symptoms are consistent with a viral respiratory infection.  If they continue longer than 10 days, you develop a fever, or you start producing bloody mucus from your nose or coughing up rusty looking mucus please return for reevaluation and we can reassess the need for antibiotics at that time.  Continue to use over-the-counter Tylenol  as needed for any pain or fever.  Use the Tessalon  Perles every 8 hours as needed for cough and congestion.  I have prescribed some Promethazine  DM cough syrup that you can use at bedtime.  Make sure that you are taking this several hours away from any potassium supplement that she may be taking.  I have also prescribed an albuterol  inhaler and spacer and you can do 1 or 2 puffs every 4-6 hours as needed for any shortness of breath or wheezing.  If you develop any new or worsening symptoms please return for reevaluation or see your primary care provider.

## 2024-05-05 NOTE — ED Triage Notes (Signed)
 Pt is with his partner Kim  Pt c/o possible sinus infection x4days  Pt states that he is sneezing, coughing, and has nasal drianage

## 2024-05-05 NOTE — ED Provider Notes (Signed)
 [facility]    CSN: [id number] Arrival date & time: 05/05/24  0930      History   Chief Complaint Chief Complaint  Patient presents with   Cough    HPI Peter Flores is a 68 y.o. male.   HPI  68 year old male with past medical history significant for atrial fibrillation, WPW, hypertension, GERD, and arthritis presents for evaluation of 4 days worth of respiratory symptoms that include runny nose and nasal congestion, sinus pressure, sneezing, and a cough.  His nasal discharge is clear and his sputum production is clear to light yellow.  He has had some wheezing but denies any shortness of breath.  He also denies fever.  Past Medical History:  Diagnosis Date   A-fib [facility])    Anxiety    Arthritis    Esophageal reflux 05/15/1999   Family hx colonic polyps    Hemorrhoids    Hiatal hernia 05/15/1999   IBS (irritable bowel syndrome)    Personal history of colonic polyps 05/15/1999   tubulovillous adenoma   Rectal fissure 04/30/1999   Wolff-Parkinson-White (WPW) syndrome    pt states this was a mis diagnosis.    Patient Active Problem List   Diagnosis Date Noted   Gastric intestinal metaplasia 02/13/2023   Abdominal pain 11/23/2019   History of adenomatous polyp of colon 11/23/2019   History of Helicobacter infection 11/23/2019   GERD (gastroesophageal reflux disease) 06/25/2017   Heat cramps 12/02/2013   WPW (Wolff-Parkinson-White syndrome) 08/06/2011   HTN (hypertension) 08/06/2011   Palpitation 08/06/2011   Dyspnea 08/06/2011    Past Surgical History:  Procedure Laterality Date   BACK SURGERY     x 2    BIOPSY  12/09/2019   Procedure: BIOPSY;  Surgeon: Eartha Angelia Sieving, MD;  Location: [facility];  Service: Gastroenterology;;  antrum, gastric body   BIOPSY  02/13/2023   Procedure: BIOPSY;  Surgeon: Eartha Angelia Sieving, MD;  Location: [facility];  Service: Gastroenterology;;   CARDIAC ELECTROPHYSIOLOGY STUDY AND ABLATION      COLONOSCOPY     COLONOSCOPY WITH PROPOFOL  N/A 12/09/2019   Procedure: COLONOSCOPY WITH PROPOFOL ;  Surgeon: Eartha Angelia Sieving, MD;  Location: [facility];  Service: Gastroenterology;  Laterality: N/A;  730   ESOPHAGOGASTRODUODENOSCOPY (EGD) WITH PROPOFOL  N/A 12/09/2019   Procedure: ESOPHAGOGASTRODUODENOSCOPY (EGD) WITH PROPOFOL ;  Surgeon: Eartha Angelia Sieving, MD;  Location: [facility];  Service: Gastroenterology;  Laterality: N/A;   ESOPHAGOGASTRODUODENOSCOPY (EGD) WITH PROPOFOL  N/A 02/13/2023   Procedure: ESOPHAGOGASTRODUODENOSCOPY (EGD) WITH PROPOFOL ;  Surgeon: Eartha Angelia Sieving, MD;  Location: [facility];  Service: Gastroenterology;  Laterality: N/A;  11:30am;asa 1   KNEE SURGERY     Bilateral    POLYPECTOMY     POLYPECTOMY  12/09/2019   Procedure: POLYPECTOMY;  Surgeon: Eartha Angelia Sieving, MD;  Location: [facility];  Service: Gastroenterology;;  sigmoid       Home Medications    Prior to Admission medications  Medication Sig Start Date End Date Taking? Authorizing Provider  acetaminophen  (TYLENOL ) 500 MG tablet Take 500-1,000 mg by mouth every 6 (six) hours as needed (for pain.).   Yes [provider]  albuterol  (VENTOLIN  HFA) 108 (90 Base) MCG/ACT inhaler Inhale 2 puffs into the lungs every 4 (four) hours as needed. 05/05/24  Yes Bernardino Ditch, NP  ascorbic acid (VITAMIN C) 500 MG tablet Take 500 mg by mouth daily.   Yes [provider]  aspirin EC 81 MG tablet Take 81  mg by mouth daily. Swallow whole.   Yes [provider]  atorvastatin (LIPITOR) 40 MG tablet Take 40 mg by mouth daily.   Yes [provider]  benzonatate  (TESSALON ) 100 MG capsule Take 2 capsules (200 mg total) by mouth 3 (three) times daily as needed for cough. 05/05/24  Yes Bernardino Ditch, NP  Cholecalciferol (VITAMIN D PO) Take 5,000 Units by mouth daily.    Yes [provider]  ELIQUIS 5 MG TABS tablet Take 5 mg by mouth 2 (two) times  daily.   Yes [provider]  Anselm Oil 500 MG CAPS Take 500 mg by mouth daily.   Yes [provider]  loratadine  (CLARITIN ) 10 MG tablet Take 1 tablet (10 mg total) by mouth daily. 12/15/22  Yes Van Knee, MD  losartan (COZAAR) 50 MG tablet Take 100 mg by mouth daily.   Yes [provider]  metoprolol  succinate (TOPROL -XL) 25 MG 24 hr tablet Take by mouth.   Yes [provider]  Omega-3 1000 MG CAPS Take by mouth.   Yes [provider]  omeprazole (PRILOSEC) 20 MG capsule Take 20 mg by mouth daily.   Yes [provider]  promethazine -dextromethorphan (PROMETHAZINE -DM) 6.25-15 MG/5ML syrup Take 5 mLs by mouth 4 (four) times daily as needed. 05/05/24  Yes Bernardino Ditch, NP  Spacer/Aero-Holding Chambers (AEROCHAMBER MV) inhaler Use as instructed 05/05/24  Yes Bernardino Ditch, NP  triamcinolone  cream (KENALOG ) 0.1 % Apply 1 Application topically 2 (two) times daily. Apply for 2 weeks. May use on face 12/15/22  Yes Van Knee, MD  potassium chloride  SA (KLOR-CON ) 20 MEQ tablet Take 2 tablets (40 mEq total) by mouth once for 1 dose. Take in the AM of 12/08/2019 (day before colonoscopy) 12/07/19 12/15/22  Eartha Angelia Sieving, MD    Family History Family History  Problem Relation Age of Onset   Colon polyps Brother    Coronary artery disease Brother 86       Stent   Heart disease Brother    Lung cancer Mother    Cancer Father    Cirrhosis Father    Colon cancer Neg Hx     Social History Social History[1]   Allergies   Lisinopril, Vicodin [hydrocodone-acetaminophen ], Amlodipine , and Biaxin [clarithromycin]   Review of Systems Review of Systems  Constitutional:  Negative for fever.  HENT:  Positive for congestion, rhinorrhea, sinus pressure and sneezing. Negative for ear pain.   Respiratory:  Positive for cough and wheezing. Negative for shortness of breath.      Physical Exam Triage Vital Signs ED Triage Vitals   Encounter Vitals Group     BP --      Girls Systolic BP Percentile --      Girls Diastolic BP Percentile --      Boys Systolic BP Percentile --      Boys Diastolic BP Percentile --      Pulse --      Resp --      Temp --      Temp Source 05/05/24 1002 Oral     SpO2 --      Weight 05/05/24 1001 202 lb 3.2 oz (91.7 kg)     Height --      Head Circumference --      Peak Flow --      Pain Score 05/05/24 1001 3     Pain Loc --      Pain Education --      Exclude from  Growth Chart --    No data found.  Updated Vital Signs BP (!) 154/79 (BP Location: Left Arm)   Pulse (!) 58   Temp 97.9 F (36.6 C) (Oral)   Wt 202 lb 3.2 oz (91.7 kg)   SpO2 100%   BMI 26.68 kg/m   Visual Acuity Right Eye Distance:   Left Eye Distance:   Bilateral Distance:    Right Eye Near:   Left Eye Near:    Bilateral Near:     Physical Exam Vitals and nursing note reviewed.  Constitutional:      Appearance: Normal appearance. He is not ill-appearing.  HENT:     Head: Normocephalic and atraumatic.     Right Ear: Tympanic membrane, ear canal and external ear normal. There is no impacted cerumen.     Left Ear: Tympanic membrane, ear canal and external ear normal. There is no impacted cerumen.     Nose: Congestion and rhinorrhea present.     Comments: Nasal mucosa is erythematous and edematous with scant clear discharge in both nares.    Mouth/Throat:     Mouth: Mucous membranes are moist.     Pharynx: Oropharynx is clear. No oropharyngeal exudate or posterior oropharyngeal erythema.  Cardiovascular:     Rate and Rhythm: Normal rate and regular rhythm.     Pulses: Normal pulses.     Heart sounds: Normal heart sounds. No murmur heard.    No friction rub. No gallop.  Pulmonary:     Effort: Pulmonary effort is normal.     Breath sounds: Normal breath sounds. No wheezing, rhonchi or rales.  Musculoskeletal:     Cervical back: Normal range of motion and neck supple. No tenderness.   Lymphadenopathy:     Cervical: No cervical adenopathy.  Skin:    General: Skin is warm and dry.     Capillary Refill: Capillary refill takes less than 2 seconds.     Findings: No rash.  Neurological:     General: No focal deficit present.     Mental Status: He is alert and oriented to person, place, and time.      UC Treatments / Results  Labs (all labs ordered are listed, but only abnormal results are displayed) Labs Reviewed - No data to display  EKG   Radiology No results found.  Procedures Procedures (including critical care time)  Medications Ordered in UC Medications - No data to display  Initial Impression / Assessment and Plan / UC Course  I have reviewed the triage vital signs and the nursing notes.  Pertinent labs & imaging results that were available during my care of the patient were reviewed by me and considered in my medical decision making (see chart for details).   Patient is a pleasant, nontoxic-appearing 68 year old male presenting for evaluation of 4 days worth of respiratory symptoms as outlined in HPI above.  He has been using Tessalon  Perles at home which do help him with his symptoms.  Given that he had atrial fibrillation in the past he is reluctant to use much over-the-counter cold medication.  I assured him that using medications like Claritin , Zyrtec, or Allegra should be fine but he should stay away from Sudafed or Benadryl.  He may also use plain over-the-counter Mucinex to help thin out his mucus to make it easier for his body to clear.  He states that he will not use nasal sprays and he will not do sinus irrigation.  I suggest that he increase his  oral fluid intake so that he improves hydration to help thin out his mucus and make it easier for him to clear.  The patient did take a home COVID test that was negative so I will not repeat testing here.  I will refill his Tessalon  Perles as well as prescribe Promethazine  DM cough syrup.  Additionally, I  will prescribe an albuterol  inhaler and a spacer and he can do 1 or 2 puffs every 4-6 hours as needed for any shortness of breath or wheezing.  Return precautions reviewed.   Final Clinical Impressions(s) / UC Diagnoses   Final diagnoses:  Nasal congestion  Viral URI with cough     Discharge Instructions      As we discussed, your symptoms are consistent with a viral respiratory infection.  If they continue longer than 10 days, you develop a fever, or you start producing bloody mucus from your nose or coughing up rusty looking mucus please return for reevaluation and we can reassess the need for antibiotics at that time.  Continue to use over-the-counter Tylenol  as needed for any pain or fever.  Use the Tessalon  Perles every 8 hours as needed for cough and congestion.  I have prescribed some Promethazine  DM cough syrup that you can use at bedtime.  Make sure that you are taking this several hours away from any potassium supplement that she may be taking.  I have also prescribed an albuterol  inhaler and spacer and you can do 1 or 2 puffs every 4-6 hours as needed for any shortness of breath or wheezing.  If you develop any new or worsening symptoms please return for reevaluation or see your primary care provider.     ED Prescriptions     Medication Sig Dispense Auth. Provider   benzonatate  (TESSALON ) 100 MG capsule Take 2 capsules (200 mg total) by mouth 3 (three) times daily as needed for cough. 30 capsule Bernardino Ditch, NP   albuterol  (VENTOLIN  HFA) 108 (90 Base) MCG/ACT inhaler Inhale 2 puffs into the lungs every 4 (four) hours as needed. 18 g Bernardino Ditch, NP   Spacer/Aero-Holding Chambers (AEROCHAMBER MV) inhaler Use as instructed 1 each Bernardino Ditch, NP   promethazine -dextromethorphan (PROMETHAZINE -DM) 6.25-15 MG/5ML syrup Take 5 mLs by mouth 4 (four) times daily as needed. 118 mL Bernardino Ditch, NP      PDMP not reviewed this encounter.    [1]  Social History Tobacco  Use   Smoking status: Former    Current packs/day: 0.00    Average packs/day: 0.3 packs/day for 10.0 years (2.5 ttl pk-yrs)    Types: Cigarettes    Start date: 08/06/1971    Quit date: 08/05/1981    Years since quitting: 42.7   Smokeless tobacco: Never  Vaping Use   Vaping status: Never Used  Substance Use Topics   Alcohol use: No   Drug use: No     Bernardino Ditch, NP 05/05/24 1021
